# Patient Record
Sex: Female | Born: 1945
Health system: Southern US, Community
[De-identification: ages and names within clinical notes are randomized; demographics above are authoritative.]

## PROBLEM LIST (undated history)

## (undated) DIAGNOSIS — I10 Essential (primary) hypertension: Secondary | ICD-10-CM

## (undated) DIAGNOSIS — I639 Cerebral infarction, unspecified: Secondary | ICD-10-CM

## (undated) HISTORY — DX: Cerebral infarction, unspecified: I63.9

## (undated) HISTORY — DX: Essential (primary) hypertension: I10

---

## 1999-02-08 ENCOUNTER — Encounter: Payer: Self-pay | Admitting: Emergency Medicine

## 1999-02-08 ENCOUNTER — Emergency Department (HOSPITAL_COMMUNITY): Admission: EM | Admit: 1999-02-08 | Discharge: 1999-02-09 | Payer: Self-pay | Admitting: Emergency Medicine

## 1999-05-18 ENCOUNTER — Other Ambulatory Visit: Admission: RE | Admit: 1999-05-18 | Discharge: 1999-05-18 | Payer: Self-pay | Admitting: Obstetrics and Gynecology

## 1999-07-04 ENCOUNTER — Encounter: Admission: RE | Admit: 1999-07-04 | Discharge: 1999-07-04 | Payer: Self-pay | Admitting: Obstetrics and Gynecology

## 1999-07-04 ENCOUNTER — Encounter: Payer: Self-pay | Admitting: Obstetrics and Gynecology

## 1999-07-17 ENCOUNTER — Encounter: Admission: RE | Admit: 1999-07-17 | Discharge: 1999-07-17 | Payer: Self-pay | Admitting: Obstetrics and Gynecology

## 1999-07-17 ENCOUNTER — Encounter: Payer: Self-pay | Admitting: Obstetrics and Gynecology

## 1999-08-24 ENCOUNTER — Emergency Department (HOSPITAL_COMMUNITY): Admission: EM | Admit: 1999-08-24 | Discharge: 1999-08-25 | Payer: Self-pay | Admitting: Emergency Medicine

## 2000-05-23 ENCOUNTER — Other Ambulatory Visit: Admission: RE | Admit: 2000-05-23 | Discharge: 2000-05-23 | Payer: Self-pay | Admitting: Obstetrics and Gynecology

## 2000-12-16 ENCOUNTER — Encounter: Payer: Self-pay | Admitting: Emergency Medicine

## 2000-12-16 ENCOUNTER — Emergency Department (HOSPITAL_COMMUNITY): Admission: EM | Admit: 2000-12-16 | Discharge: 2000-12-17 | Payer: Self-pay | Admitting: Emergency Medicine

## 2000-12-20 ENCOUNTER — Encounter: Payer: Self-pay | Admitting: *Deleted

## 2000-12-20 ENCOUNTER — Emergency Department (HOSPITAL_COMMUNITY): Admission: EM | Admit: 2000-12-20 | Discharge: 2000-12-20 | Payer: Self-pay | Admitting: *Deleted

## 2001-01-20 ENCOUNTER — Encounter: Payer: Self-pay | Admitting: Internal Medicine

## 2001-01-20 ENCOUNTER — Ambulatory Visit (HOSPITAL_COMMUNITY): Admission: RE | Admit: 2001-01-20 | Discharge: 2001-01-20 | Payer: Self-pay | Admitting: Internal Medicine

## 2001-02-12 ENCOUNTER — Encounter: Admission: RE | Admit: 2001-02-12 | Discharge: 2001-02-12 | Payer: Self-pay | Admitting: Obstetrics and Gynecology

## 2001-02-12 ENCOUNTER — Encounter: Payer: Self-pay | Admitting: Obstetrics and Gynecology

## 2001-08-13 ENCOUNTER — Other Ambulatory Visit: Admission: RE | Admit: 2001-08-13 | Discharge: 2001-08-13 | Payer: Self-pay | Admitting: Obstetrics and Gynecology

## 2002-08-06 ENCOUNTER — Encounter: Payer: Self-pay | Admitting: Obstetrics and Gynecology

## 2002-08-06 ENCOUNTER — Ambulatory Visit (HOSPITAL_COMMUNITY): Admission: RE | Admit: 2002-08-06 | Discharge: 2002-08-06 | Payer: Self-pay | Admitting: Obstetrics and Gynecology

## 2002-08-19 ENCOUNTER — Other Ambulatory Visit: Admission: RE | Admit: 2002-08-19 | Discharge: 2002-08-19 | Payer: Self-pay | Admitting: Obstetrics and Gynecology

## 2005-02-22 ENCOUNTER — Encounter (HOSPITAL_COMMUNITY): Admission: RE | Admit: 2005-02-22 | Discharge: 2005-03-24 | Payer: Self-pay | Admitting: Psychiatry

## 2007-03-04 ENCOUNTER — Ambulatory Visit (HOSPITAL_COMMUNITY): Admission: RE | Admit: 2007-03-04 | Discharge: 2007-03-04 | Payer: Self-pay | Admitting: Obstetrics and Gynecology

## 2008-07-06 ENCOUNTER — Ambulatory Visit (HOSPITAL_COMMUNITY): Admission: RE | Admit: 2008-07-06 | Discharge: 2008-07-06 | Payer: Self-pay | Admitting: Obstetrics and Gynecology

## 2008-07-23 ENCOUNTER — Emergency Department (HOSPITAL_COMMUNITY): Admission: EM | Admit: 2008-07-23 | Discharge: 2008-07-23 | Payer: Self-pay | Admitting: Emergency Medicine

## 2009-08-15 ENCOUNTER — Ambulatory Visit (HOSPITAL_COMMUNITY): Admission: RE | Admit: 2009-08-15 | Discharge: 2009-08-15 | Payer: Self-pay | Admitting: Obstetrics and Gynecology

## 2010-01-31 ENCOUNTER — Ambulatory Visit: Payer: Self-pay | Admitting: Internal Medicine

## 2010-01-31 DIAGNOSIS — E669 Obesity, unspecified: Secondary | ICD-10-CM | POA: Insufficient documentation

## 2010-01-31 DIAGNOSIS — E785 Hyperlipidemia, unspecified: Secondary | ICD-10-CM | POA: Insufficient documentation

## 2010-01-31 DIAGNOSIS — I10 Essential (primary) hypertension: Secondary | ICD-10-CM | POA: Insufficient documentation

## 2010-01-31 DIAGNOSIS — E119 Type 2 diabetes mellitus without complications: Secondary | ICD-10-CM | POA: Insufficient documentation

## 2010-02-13 LAB — CONVERTED CEMR LAB
Albumin: 3.5 g/dL (ref 3.5–5.2)
Alkaline Phosphatase: 88 units/L (ref 39–117)
Basophils Absolute: 0.1 10*3/uL (ref 0.0–0.1)
Calcium: 9.4 mg/dL (ref 8.4–10.5)
Chloride: 103 meq/L (ref 96–112)
Direct LDL: 174.4 mg/dL
HCT: 34.7 % — ABNORMAL LOW (ref 36.0–46.0)
HDL: 35.8 mg/dL — ABNORMAL LOW (ref >39.00)
Lymphocytes Relative: 22.6 % (ref 12.0–46.0)
Lymphs Abs: 2 10*3/uL (ref 0.7–4.0)
MCHC: 33.3 g/dL (ref 30.0–36.0)
MCV: 85 fL (ref 78.0–100.0)
Neutro Abs: 5.9 10*3/uL (ref 1.4–7.7)
Platelets: 248 10*3/uL (ref 150.0–400.0)
Potassium: 4.4 meq/L (ref 3.5–5.1)
RBC: 4.08 M/uL (ref 3.87–5.11)
Sodium: 137 meq/L (ref 135–145)
Total Bilirubin: 0.5 mg/dL (ref 0.3–1.2)
Total Protein: 6.6 g/dL (ref 6.0–8.3)

## 2010-04-30 ENCOUNTER — Encounter: Payer: Self-pay | Admitting: Obstetrics and Gynecology

## 2010-05-01 ENCOUNTER — Encounter: Payer: Self-pay | Admitting: Family Medicine

## 2010-05-09 NOTE — Assessment & Plan Note (Signed)
Summary: new to est//bcbs//lch   Vital Signs:  Patient profile:   65 year old female Height:      59.5 inches Weight:      183 pounds BMI:     36.47 Temp:     98.4 degrees F oral Pulse rate:   72 / minute Pulse rhythm:   regular BP sitting:   148 / 82  (left arm) Cuff size:   large  Vitals Entered By: Alfred Levins, CMA (January 31, 2010 10:12 AM)  Serial Vital Signs/Assessments:  Time      Position  BP       Pulse  Resp  Temp     By                     138/82                         Birdie Sons MD  CC: establish, fasting   CC:  establish and fasting.  History of Present Illness: here to establish  Preventive Screening-Counseling & Management  Alcohol-Tobacco     Smoking Status: never  Caffeine-Diet-Exercise     Does Patient Exercise: no      Drug Use:  no.    Current Problems (verified): 1)  Hypertension  (ICD-401.9) 2)  Hyperlipidemia  (ICD-272.4) 3)  Diabetes Mellitus, Type II  (ICD-250.00)  Current Medications (verified): 1)  Vitamin D3 50000 Unit Caps (Cholecalciferol) .Marland Kitchen.. 1 By Mouth Weekly 2)  Lisinopril-Hydrochlorothiazide 20-25 Mg  Tabs (Lisinopril-Hydrochlorothiazide) .... Take 1 Tab By Mouth Daily 3)  Simvastatin 80 Mg Tabs (Simvastatin) .Marland Kitchen.. 1 By Mouth At Bedtime 4)  Labetalol Hcl 300 Mg  Tabs (Labetalol Hcl) .... Take 1 Tab By Mouth Twice A Day 5)  Aspirin 325 Mg Tabs (Aspirin) .... Take 1 Tab By Mouth Every Day 6)  Glyburide-Metformin 2.5-500 Mg Tabs (Glyburide-Metformin) .Marland Kitchen.. 1 By Mouth Two Times A Day 7)  Verapamil Hcl Cr 120 Mg Cr-Tabs (Verapamil Hcl) .Marland Kitchen.. 1 By Mouth Once Daily  Allergies (verified): 1)  * Janumet  Past History:  Family History: Last updated: 01/31/2010 Brain Cancer - Brother deceased age 37 Family History Breast cancer 1st degree relative <50--2 sisters mother father-deceased pneumonia age 67, hx of seizure disorder  mother-- dm2, htn  Social History: Last updated: 01/31/2010 Married Never Smoked Alcohol  use-no Drug use-no Regular exercise-no  Risk Factors: Exercise: no (01/31/2010)  Risk Factors: Smoking Status: never (01/31/2010)  Past Medical History: Diabetes mellitus, type II Hyperlipidemia Hypertension  Past Surgical History: Hysterectomy   Family History: Brain Cancer - Brother deceased age 31 Family History Breast cancer 1st degree relative <50--2 sisters mother father-deceased pneumonia age 32, hx of seizure disorder  mother-- dm2, htn  Social History: Married Never Smoked Alcohol use-no Drug use-no Regular exercise-no Smoking Status:  never Drug Use:  no Does Patient Exercise:  no   Physical Exam  General:  overweight female in no acute distress. HEENT exam atraumatic, normocephalic symmetric her muscles are intact. Conjunctivae are pink. Neck is supple without lymphadenopathy or thyromegaly. Chest clear to auscultation without increased work of breathing. Cardiac exam S1-S2 are regular. Abdominal exam overweight, to bowel sounds, soft, nontender. She has a lot of central obesity. Extities there is no clubbing cyanosis or edema. Neurologic exam she is alert and oriented. Gait is normal.  Diabetes Management Exam:    Eye Exam:       Eye Exam done elsewhere  Date: 01/07/2010          Results: normal-pt's report          Done by: ophthalmology   Impression & Recommendations:  Problem # 1:  HYPERTENSION (ICD-401.9)  Her updated medication list for this problem includes:    Lisinopril-hydrochlorothiazide 20-25 Mg Tabs (Lisinopril-hydrochlorothiazide) .Marland Kitchen... Take 1 tab by mouth daily    Labetalol Hcl 300 Mg Tabs (Labetalol hcl) .Marland Kitchen... Take 1 tab by mouth twice a day    Verapamil Hcl Cr 120 Mg Cr-tabs (Verapamil hcl) .Marland Kitchen... 1 by mouth once daily  BP today: 148/82  Orders: Venipuncture (16109) TLB-BMP (Basic Metabolic Panel-BMET) (80048-METABOL) Specimen Handling (60454)  Problem # 2:  HYPERLIPIDEMIA (ICD-272.4)  needs labs Her updated  medication list for this problem includes:    Simvastatin 80 Mg Tabs (Simvastatin) .Marland Kitchen... 1 by mouth at bedtime  Orders: TLB-TSH (Thyroid Stimulating Hormone) (84443-TSH) TLB-Hepatic/Liver Function Pnl (80076-HEPATIC) Specimen Handling (09811)  Problem # 3:  DIABETES MELLITUS, TYPE II (ICD-250.00)  check labs today she does not check CBGs at home regularly--i'll review labs prior to recommendation of CBG monitoring Her updated medication list for this problem includes:    Lisinopril-hydrochlorothiazide 20-25 Mg Tabs (Lisinopril-hydrochlorothiazide) .Marland Kitchen... Take 1 tab by mouth daily    Aspirin 325 Mg Tabs (Aspirin) .Marland Kitchen... Take 1 tab by mouth every day    Glyburide-metformin 2.5-500 Mg Tabs (Glyburide-metformin) .Marland Kitchen... 1 by mouth two times a day  Orders: TLB-Lipid Panel (80061-LIPID) TLB-CBC Platelet - w/Differential (85025-CBCD) TLB-A1C / Hgb A1C (Glycohemoglobin) (83036-A1C) Specimen Handling (91478)  Problem # 4:  OBESITY (ICD-278.00) this is likely her most significant problem. Discussed need for weight loss. May need referral to nutritionist. Will review labs first.  Complete Medication List: 1)  Vitamin D3 50000 Unit Caps (Cholecalciferol) .Marland Kitchen.. 1 by mouth weekly 2)  Lisinopril-hydrochlorothiazide 20-25 Mg Tabs (Lisinopril-hydrochlorothiazide) .... Take 1 tab by mouth daily 3)  Simvastatin 80 Mg Tabs (Simvastatin) .Marland Kitchen.. 1 by mouth at bedtime 4)  Labetalol Hcl 300 Mg Tabs (Labetalol hcl) .... Take 1 tab by mouth twice a day 5)  Aspirin 325 Mg Tabs (Aspirin) .... Take 1 tab by mouth every day 6)  Glyburide-metformin 2.5-500 Mg Tabs (Glyburide-metformin) .Marland Kitchen.. 1 by mouth two times a day 7)  Verapamil Hcl Cr 120 Mg Cr-tabs (Verapamil hcl) .Marland Kitchen.. 1 by mouth once daily  Other Orders: Admin 1st Vaccine (29562) Flu Vaccine 30yrs + [phone])  Preventive Care Screening  Colonoscopy:    Date:  04/10/2007    Next Due:  04/2017    Results:  normal-pts report   Mammogram:    Date:   01/07/2010    Next Due:  01/2012    Results:  normal-pt's report   Pap Smear:    Date:  01/07/2010    Next Due:  01/2013    Results:  normal-pt's report   Last Flu Shot:    Date:  01/31/2010    Results:  Fluvax 3+  Last Pneumovax:    Date:  04/09/1993    Results:  given    Patient Instructions: 1)  see me 4 months Flu Vaccine Consent Questions     Do you have a history of severe allergic reactions to this vaccine? no    Any prior history of allergic reactions to egg and/or gelatin? no    Do you have a sensitivity to the preservative Thimersol? no    Do you have a past history of Guillan-Barre Syndrome? no    Do you currently have an  acute febrile illness? no    Have you ever had a severe reaction to latex? no    Vaccine information given and explained to patient? yes    Are you currently pregnant? no    Lot Number:[id number]   Exp Date:10/07/2010   Site Given  Left Deltoid IM  Orders Added: 1)  Admin 1st Vaccine [90471] 2)  Flu Vaccine 59yrs + [86578] 3)  New Patient Level IV [99204] 4)  Venipuncture [36415] 5)  TLB-BMP (Basic Metabolic Panel-BMET) [80048-METABOL] 6)  TLB-Lipid Panel [80061-LIPID] 7)  TLB-TSH (Thyroid Stimulating Hormone) [84443-TSH] 8)  TLB-Hepatic/Liver Function Pnl [80076-HEPATIC] 9)  TLB-CBC Platelet - w/Differential [85025-CBCD] 10)  TLB-A1C / Hgb A1C (Glycohemoglobin) [83036-A1C] 11)  Specimen Handling [99000]   .[email or web address]    Appended Document: new to est//bcbs//lch    Nurse Visit   Allergies: 1)  * Janumet  Immunizations Administered:  Tetanus Vaccine:    Vaccine Type: Tdap    Site: right deltoid    Mfr: GlaxoSmithKline    Dose: 0.5 ml    Route: IM    Given by: Alfred Levins, CMA    Exp. Date: 01/27/2012    Lot #: [id number]  Zostavax # 1:    Vaccine Type: Zostavax    Site: right arm    Mfr: Merck    Dose: 0.5 ml    Route: IM    Given by: Alfred Levins, CMA    Exp. Date: 11/25/2010    Lot #: [id number]  Orders Added: 1)   Tdap => 45yrs IM [90715] 2)  Admin 1st Vaccine [90471] 3)  Zoster (Shingles) Vaccine Live [90736] 4)  Admin of Any Addtl Vaccine [10272]

## 2010-05-10 ENCOUNTER — Encounter: Payer: Self-pay | Admitting: Obstetrics and Gynecology

## 2010-08-14 ENCOUNTER — Other Ambulatory Visit (HOSPITAL_COMMUNITY): Payer: Self-pay | Admitting: Obstetrics and Gynecology

## 2010-08-14 DIAGNOSIS — Z1231 Encounter for screening mammogram for malignant neoplasm of breast: Secondary | ICD-10-CM

## 2010-08-29 ENCOUNTER — Ambulatory Visit (HOSPITAL_COMMUNITY)
Admission: RE | Admit: 2010-08-29 | Discharge: 2010-08-29 | Disposition: A | Discharge status: Home / Self Care | Payer: Medicare Other | Source: Ambulatory Visit | Attending: Obstetrics and Gynecology | Admitting: Obstetrics and Gynecology

## 2010-08-29 DIAGNOSIS — Z1231 Encounter for screening mammogram for malignant neoplasm of breast: Secondary | ICD-10-CM

## 2011-08-07 ENCOUNTER — Other Ambulatory Visit (HOSPITAL_COMMUNITY): Payer: Self-pay | Admitting: Obstetrics and Gynecology

## 2011-08-07 DIAGNOSIS — Z1231 Encounter for screening mammogram for malignant neoplasm of breast: Secondary | ICD-10-CM

## 2011-09-05 ENCOUNTER — Ambulatory Visit (HOSPITAL_COMMUNITY)
Admission: RE | Admit: 2011-09-05 | Discharge: 2011-09-05 | Disposition: A | Discharge status: Home / Self Care | Payer: Medicare Other | Source: Ambulatory Visit | Attending: Obstetrics and Gynecology | Admitting: Obstetrics and Gynecology

## 2011-09-05 DIAGNOSIS — Z1231 Encounter for screening mammogram for malignant neoplasm of breast: Secondary | ICD-10-CM | POA: Insufficient documentation

## 2012-01-07 ENCOUNTER — Other Ambulatory Visit (HOSPITAL_COMMUNITY): Payer: Self-pay | Admitting: Internal Medicine

## 2012-01-07 DIAGNOSIS — Z0000 Encounter for general adult medical examination without abnormal findings: Secondary | ICD-10-CM

## 2012-01-17 ENCOUNTER — Other Ambulatory Visit (HOSPITAL_COMMUNITY): Payer: Self-pay | Admitting: Obstetrics and Gynecology

## 2012-01-17 DIAGNOSIS — M949 Disorder of cartilage, unspecified: Secondary | ICD-10-CM

## 2012-01-17 DIAGNOSIS — M899 Disorder of bone, unspecified: Secondary | ICD-10-CM

## 2012-01-22 ENCOUNTER — Ambulatory Visit (HOSPITAL_COMMUNITY): Payer: Medicare PPO

## 2012-01-28 ENCOUNTER — Ambulatory Visit (HOSPITAL_COMMUNITY)
Admission: RE | Admit: 2012-01-28 | Discharge: 2012-01-28 | Disposition: A | Discharge status: Home / Self Care | Payer: Medicare Other | Source: Ambulatory Visit | Attending: Obstetrics and Gynecology | Admitting: Obstetrics and Gynecology

## 2012-01-28 DIAGNOSIS — Z780 Asymptomatic menopausal state: Secondary | ICD-10-CM | POA: Insufficient documentation

## 2012-01-28 DIAGNOSIS — Z13820 Encounter for screening for osteoporosis: Secondary | ICD-10-CM | POA: Insufficient documentation

## 2012-01-28 DIAGNOSIS — M949 Disorder of cartilage, unspecified: Secondary | ICD-10-CM | POA: Insufficient documentation

## 2012-01-28 DIAGNOSIS — M899 Disorder of bone, unspecified: Secondary | ICD-10-CM

## 2012-09-29 ENCOUNTER — Other Ambulatory Visit (HOSPITAL_COMMUNITY): Payer: Self-pay | Admitting: Obstetrics and Gynecology

## 2012-09-29 DIAGNOSIS — Z1231 Encounter for screening mammogram for malignant neoplasm of breast: Secondary | ICD-10-CM

## 2012-10-13 ENCOUNTER — Ambulatory Visit (HOSPITAL_COMMUNITY): Payer: Medicare Other | Attending: Obstetrics and Gynecology

## 2012-10-17 ENCOUNTER — Other Ambulatory Visit (HOSPITAL_COMMUNITY): Payer: Self-pay | Admitting: Obstetrics and Gynecology

## 2012-10-17 DIAGNOSIS — Z1231 Encounter for screening mammogram for malignant neoplasm of breast: Secondary | ICD-10-CM

## 2012-10-27 ENCOUNTER — Ambulatory Visit (HOSPITAL_COMMUNITY): Payer: Medicare Other

## 2012-11-04 ENCOUNTER — Ambulatory Visit (HOSPITAL_COMMUNITY)
Admission: RE | Admit: 2012-11-04 | Discharge: 2012-11-04 | Disposition: A | Discharge status: Home / Self Care | Payer: Medicare Other | Source: Ambulatory Visit | Attending: Obstetrics and Gynecology | Admitting: Obstetrics and Gynecology

## 2012-11-04 DIAGNOSIS — Z1231 Encounter for screening mammogram for malignant neoplasm of breast: Secondary | ICD-10-CM | POA: Insufficient documentation

## 2013-03-30 ENCOUNTER — Ambulatory Visit (HOSPITAL_COMMUNITY)
Admission: RE | Admit: 2013-03-30 | Discharge: 2013-03-30 | Disposition: A | Discharge status: Home / Self Care | Payer: Medicare Other | Source: Ambulatory Visit | Attending: Family Medicine | Admitting: Family Medicine

## 2013-03-30 ENCOUNTER — Other Ambulatory Visit (HOSPITAL_COMMUNITY): Payer: Self-pay | Admitting: Family Medicine

## 2013-03-30 DIAGNOSIS — R059 Cough, unspecified: Secondary | ICD-10-CM | POA: Insufficient documentation

## 2013-03-30 DIAGNOSIS — J209 Acute bronchitis, unspecified: Secondary | ICD-10-CM

## 2013-03-30 DIAGNOSIS — R05 Cough: Secondary | ICD-10-CM | POA: Insufficient documentation

## 2013-03-30 DIAGNOSIS — IMO0002 Reserved for concepts with insufficient information to code with codable children: Secondary | ICD-10-CM | POA: Insufficient documentation

## 2013-03-30 DIAGNOSIS — R0989 Other specified symptoms and signs involving the circulatory and respiratory systems: Secondary | ICD-10-CM | POA: Insufficient documentation

## 2013-06-25 ENCOUNTER — Ambulatory Visit (INDEPENDENT_AMBULATORY_CARE_PROVIDER_SITE_OTHER): Payer: Medicare HMO | Admitting: Internal Medicine

## 2013-06-25 ENCOUNTER — Encounter: Payer: Self-pay | Admitting: Internal Medicine

## 2013-06-25 VITALS — BP 150/64 | HR 61 | Temp 97.4°F | Ht 59.0 in | Wt 143.0 lb

## 2013-06-25 DIAGNOSIS — I10 Essential (primary) hypertension: Secondary | ICD-10-CM

## 2013-06-25 DIAGNOSIS — R058 Other specified cough: Secondary | ICD-10-CM

## 2013-06-25 DIAGNOSIS — R05 Cough: Secondary | ICD-10-CM

## 2013-06-25 DIAGNOSIS — R059 Cough, unspecified: Secondary | ICD-10-CM

## 2013-06-25 MED ORDER — VALSARTAN-HYDROCHLOROTHIAZIDE 160-25 MG PO TABS: 1.0000 | ORAL_TABLET | Freq: Every day | ORAL | Status: DC

## 2013-06-25 NOTE — Patient Instructions (Signed)
Try off lisinopril  Valsartan 160/25 one daily  X 6 weeks and if you start coughing again go ahead and take the tessilon  GERD (REFLUX)  is an extremely common cause of respiratory symptoms, many times with no significant heartburn at all.    It can be treated with medication, but also with lifestyle changes including avoidance of late meals, excessive alcohol, smoking cessation, and avoid fatty foods, chocolate, peppermint, colas, red wine, and acidic juices such as orange juice.  NO MINT OR MENTHOL PRODUCTS SO NO COUGH DROPS  USE SUGARLESS CANDY INSTEAD (jolley ranchers or Stover's)  NO OIL BASED VITAMINS - use powdered substitutes.    If all better in 6 weeks see your primary doctor and if not return here.

## 2013-06-25 NOTE — Progress Notes (Signed)
   Subjective:    Patient ID: Shelly Bauer, female    DOB: 1945/05/06   MRN: [id number]  HPI  [age] yowf never smoker with onset of ? [place] 6620s [place] rx then 2002 returned to [place] sev times a year spring / fall  a cough seemed to get better with albuterol only for short courses and "did fine" between flares  until Dec 2014 recurrent cough / sob seemed better with streroids/ levaquin but then cough recurred 06/27/13 and self referred 06/25/2013 to pulmonary clinic.  06/25/2013 1st [facility] office visit/ Wert on ACEi Chief Complaint  Patient presents with  . Pulmonary Consult    Self referral- pt c/o cough x 1 wk-non prod and worse with exertion.    got ? Steroid shot one week prior to OV  And some better on day of ov but cough was quite severe assoc with sensation of pnds but no excess mucus, cough day > night, assoc with sense of nasal and throat congestion  No obvious other patterns in day to day or daytime variabilty or assoc sob or cp or chest tightness, subjective wheeze overt sinus or hb symptoms. No unusual exp hx or h/o childhood pna/ asthma or knowledge of premature birth.  Sleeping ok without nocturnal  or early am exacerbation  of respiratory  c/o's or need for noct saba. Also denies any obvious fluctuation of symptoms with weather or environmental changes or other aggravating or alleviating factors except as outlined above   Current Medications, Allergies, Complete Past Medical History, Past Surgical History, Family History, and Social History were reviewed in [facility] electronic medical record.            Review of Systems  Constitutional: Negative for fever, chills and unexpected weight change.  HENT: Positive for sinus pressure. Negative for congestion, dental problem, ear pain, nosebleeds, postnasal drip, rhinorrhea, sneezing, sore throat, trouble swallowing and voice change.   Eyes: Negative for visual  disturbance.  Respiratory: Positive for cough. Negative for choking and shortness of breath.   Cardiovascular: Negative for chest pain and leg swelling.  Gastrointestinal: Negative for vomiting, abdominal pain and diarrhea.  Genitourinary: Negative for difficulty urinating.  Musculoskeletal: Negative for arthralgias.  Skin: Negative for rash.  Neurological: Negative for tremors, syncope and headaches.  Hematological: Does not bruise/bleed easily.       Objective:   Physical Exam  Wt Readings from Last 3 Encounters:  06/25/13 143 lb (64.864 kg)  01/31/10 183 lb (83.008 kg)     HEENT: nl dentition, turbinates, and orophanx. Nl external ear canals without cough reflex   NECK :  without JVD/Nodes/TM/ nl carotid upstrokes bilaterally   LUNGS: no acc muscle use, clear to A and P bilaterally without cough on insp or exp maneuvers   CV:  RRR  no s3 or murmur or increase in P2, no edema   ABD:  soft and nontender with nl excursion in the supine position. No bruits or organomegaly, bowel sounds nl  MS:  warm without deformities, calf tenderness, cyanosis or clubbing  SKIN: warm and dry without lesions    NEURO:  alert, approp, no deficits     cxr 03/30/13 There is no evidence of pneumonia nor CHF nor other acute  cardiopulmonary abnormality.       Assessment & Plan:

## 2013-06-26 DIAGNOSIS — R058 Other specified cough: Secondary | ICD-10-CM | POA: Insufficient documentation

## 2013-06-26 DIAGNOSIS — R05 Cough: Secondary | ICD-10-CM | POA: Insufficient documentation

## 2013-06-26 NOTE — Assessment & Plan Note (Signed)
This is almost certainly  Classic Upper airway cough syndrome, so named because it's frequently impossible to sort out how much is  CR/sinusitis with freq throat clearing (which can be related to primary GERD)   vs  causing  secondary (" extra esophageal")  GERD from wide swings in gastric pressure that occur with throat clearing, often  promoting self use of mint and menthol lozenges that reduce the lower esophageal sphincter tone and exacerbate the problem further in a cyclical fashion.   These are the same pts (now being labeled as having "irritable larynx syndrome" by some cough centers) who not infrequently have a history of having failed to tolerate ace inhibitors,  dry powder inhalers or biphosphonates or report having atypical reflux symptoms that don't respond to standard doses of PPI , and are easily confused as having aecopd or asthma flares by even experienced allergists/ pulmonologists.  Since already treated approp and not improving rec trial off acei then return here is 6 weeks or with next flare off acei and go from there.  See instructions for specific recommendations which were reviewed directly with the patient who was given a copy with highlighter outlining the key components.

## 2013-06-26 NOTE — Assessment & Plan Note (Signed)
ACE inhibitors are problematic in  pts with airway complaints because  even experienced pulmonologists can't always distinguish ace effects from copd/asthma.  By themselves they don't actually cause a problem, much like oxygen can't by itself start a fire, but they certainly serve as a powerful catalyst or enhancer for any "fire"  or inflammatory process in the upper airway, be it caused by an ET  tube or more commonly reflux (especially in the obese or pts with known GERD or who are on biphoshonates).    In the era of ARB near equivalency until we have a better handle on the reversibility of the airway problem, it just makes sense to avoid ACEI  entirely in the short run and then decide later, having established a level of airway control using a reasonable limited regimen, whether to add back ace but even then being very careful to observe the pt for worsening airway control and number of meds used/ needed to control symptoms.    Given her tendency to recurrent severe cough I would avoid this class completely so as not to "muddy" the water in terms of sorting out her various non-specific resp symptoms

## 2013-08-18 ENCOUNTER — Telehealth: Payer: Self-pay | Admitting: Internal Medicine

## 2013-08-18 NOTE — Telephone Encounter (Signed)
Pt returning call.[name] ° °

## 2013-08-18 NOTE — Telephone Encounter (Signed)
Called spoke with pt. She reports she is not sure who rx'd verapamil for her. We gave her diovan HCT and she still takes this. She wants to know if she needs to be on verapamil. IN epic it states she takes 120 mg. Please advise MW thanks

## 2013-08-18 NOTE — Telephone Encounter (Signed)
lmomtcb x1 

## 2013-08-19 NOTE — Telephone Encounter (Signed)
Spoke with the pt and notified of recs per MW  She has been taking med  Will f/u with PCP for refills  Nothing further needed per pt

## 2013-08-19 NOTE — Telephone Encounter (Signed)
lmomtcb x1 

## 2013-08-19 NOTE — Telephone Encounter (Signed)
I did not change the rx for verapamil  - if  she's been taking the verapamil she should stay on it but see her primary for refills - come see Tammy Np in meantime if confused or needs recheck

## 2013-10-06 ENCOUNTER — Other Ambulatory Visit (HOSPITAL_COMMUNITY): Payer: Self-pay | Admitting: Obstetrics and Gynecology

## 2013-10-06 DIAGNOSIS — Z803 Family history of malignant neoplasm of breast: Secondary | ICD-10-CM

## 2013-11-10 ENCOUNTER — Ambulatory Visit (HOSPITAL_COMMUNITY): Payer: Medicare HMO | Attending: Obstetrics and Gynecology

## 2014-03-09 ENCOUNTER — Ambulatory Visit (HOSPITAL_COMMUNITY)
Admission: RE | Admit: 2014-03-09 | Discharge: 2014-03-09 | Disposition: A | Discharge status: Home / Self Care | Payer: Medicare HMO | Source: Ambulatory Visit | Attending: Obstetrics and Gynecology | Admitting: Obstetrics and Gynecology

## 2014-03-09 DIAGNOSIS — Z1231 Encounter for screening mammogram for malignant neoplasm of breast: Secondary | ICD-10-CM | POA: Insufficient documentation

## 2014-03-09 DIAGNOSIS — Z803 Family history of malignant neoplasm of breast: Secondary | ICD-10-CM

## 2014-08-03 ENCOUNTER — Other Ambulatory Visit: Payer: Self-pay | Admitting: Internal Medicine

## 2014-08-05 ENCOUNTER — Telehealth: Payer: Self-pay | Admitting: Internal Medicine

## 2014-08-05 MED ORDER — VALSARTAN-HYDROCHLOROTHIAZIDE 160-25 MG PO TABS: 1.0000 | ORAL_TABLET | Freq: Every day | ORAL | Status: DC

## 2014-08-05 NOTE — Telephone Encounter (Signed)
Ok to refill 

## 2014-08-05 NOTE — Telephone Encounter (Signed)
Pt aware RX has been refilled. Nothing further needed

## 2014-08-05 NOTE — Telephone Encounter (Signed)
Pt needing Valsartan refilled ( Rx given by MW initially ) Pt is not able to see PCP until May 16th and he will not fill this until he follows up with her. Please advise Dr Sherene SiresWert if you are okay with filling this x 1 month. Thanks

## 2014-08-05 NOTE — Telephone Encounter (Signed)
Called and spoke to pt. Pt requesting refill on valsartan. Informed pt to have her PCP fill this for her as she is doing well and MW has not seen her in over 1 year. Pt verbalized understanding and denied any further questions or concerns at this time.

## 2014-08-05 NOTE — Telephone Encounter (Signed)
lmtcb for pt.  

## 2014-08-23 ENCOUNTER — Ambulatory Visit: Payer: Medicare HMO | Admitting: Internal Medicine

## 2014-09-09 ENCOUNTER — Other Ambulatory Visit: Payer: Self-pay | Admitting: Internal Medicine

## 2014-09-13 ENCOUNTER — Ambulatory Visit (INDEPENDENT_AMBULATORY_CARE_PROVIDER_SITE_OTHER): Payer: Medicare HMO | Admitting: Internal Medicine

## 2014-09-13 ENCOUNTER — Encounter: Payer: Self-pay | Admitting: Internal Medicine

## 2014-09-13 VITALS — BP 164/80 | HR 82 | Ht 59.0 in | Wt 143.0 lb

## 2014-09-13 DIAGNOSIS — R05 Cough: Secondary | ICD-10-CM | POA: Diagnosis not present

## 2014-09-13 DIAGNOSIS — R058 Other specified cough: Secondary | ICD-10-CM

## 2014-09-13 MED ORDER — PREDNISONE 10 MG PO TABS: ORAL_TABLET | ORAL | Status: DC

## 2014-09-13 NOTE — Patient Instructions (Addendum)
Any time you start coughing for any reason go ahead and tessilon and Try over the counter prilosec 20mg   Take 30-60 min before first meal of the day and Pepcid (famotidine) 20 mg one bedtime until cough is completely gone for at least a week without the need for cough suppression  GERD (REFLUX)  is an extremely common cause of respiratory symptoms just like yours , many times with no obvious heartburn at all.    It can be treated with medication, but also with lifestyle changes including avoidance of late meals, elevation of the head of your bed (ideally with 6 inch  bed blocks) excessive alcohol, smoking cessation, and avoid fatty foods, chocolate, peppermint, colas, red wine, and acidic juices such as orange juice.  NO MINT OR MENTHOL PRODUCTS SO NO COUGH DROPS  USE SUGARLESS CANDY INSTEAD (Jolley ranchers or Stover's or Life Savers) or even ice chips will also do - the key is to swallow to prevent all throat clearing. NO OIL BASED VITAMINS - use powdered substitutes.    Prednisone 10 mg take  4 each am x 2 days,   2 each am x 2 days,  1 each am x 2 days and stop    If you are satisfied with your treatment plan,  let your doctor know and he/she can either refill your medications or you can return here when your prescription runs out.     If in any way you are not 100% satisfied,  please tell us.  If 100% better, tell your friends!  Pulmonary follow up is as needed

## 2014-09-13 NOTE — Progress Notes (Signed)
Subjective:    Patient ID: Shelly Bauer, female    DOB: [birth year]   MRN: [id number]   Brief patient profile:  [age] yowf never smoker with onset of ? [place] 8120s [place] then 2002 returned to [place] sev times a year spring / fall  a cough seemed to get better with albuterol only for short courses and "did fine" between flares  until Dec 2014 recurrent cough / sob seemed better with streroids/ levaquin but then cough recurred 06/27/13 and self referred 06/25/2013 to [facility].   History of Present Illness  06/25/2013 1st Shelly [facility] office visit/ Shelly Bauer on ACEi Chief Complaint  Patient presents with  . [facility] Consult    Self referral- pt c/o cough x 1 wk-non prod and worse with exertion.    got ? Steroid shot one week prior to OV  And some better on day of ov but cough was quite severe assoc with sensation of pnds but no excess mucus, cough day > night, assoc with sense of nasal and throat congestion rec Try off lisinopril Valsartan 160/25 one daily  X 6 weeks and if you start coughing again go ahead and take the tessilon GERD diet  F/u if not 100% better     09/13/2014 f/u ov/Shelly Bauer re: recurrent cough x one week  Chief Complaint  Patient presents with  . Follow-up    Pt c/o dry cough with burning sensation in chest. Wheezing. Denies any SOB, chest congestion.   cough had completely resolved then onset was acute, sever, worse  day > noct / has not tried  tessilon because cough  goes away quickly  Assoc with overt HB sympt and subjective wheeze  No obvious day to day or daytime variabilty or assoc c chest tightness,  or overt sinus.  No unusual exp hx or h/o childhood pna/ asthma or knowledge of premature birth.  Sleeping ok without nocturnal  or early am exacerbation  of respiratory  c/o's or need for noct saba. Also denies any obvious fluctuation of symptoms with weather or environmental changes or other aggravating or  alleviating factors except as outlined above   Current Medications, Allergies, Complete Past Medical History, Past Surgical History, Family History, and Social History were reviewed in [facility] electronic medical record.  ROS  The following are not active complaints unless bolded sore throat, dysphagia, dental problems, itching, sneezing,  nasal congestion or excess/ purulent secretions, ear ache,   fever, chills, sweats, unintended wt loss, clasically pleuritic or exertional cp, hemoptysis,  orthopnea pnd or leg swelling, presyncope, palpitations, heartburn, abdominal pain, anorexia, nausea, vomiting, diarrhea  or change in bowel or urinary habits, change in stools or urine, dysuria,hematuria,  rash, arthralgias, visual complaints, headache, numbness weakness or ataxia or problems with walking or coordination,  change in mood/affect or memory.                  Objective:   Physical Exam  Wt Readings from Last 3 Encounters:  09/13/14 143 lb (64.864 kg)  06/25/13 143 lb (64.864 kg)  01/31/10 183 lb (83.008 kg)    Vital signs reviewed     HEENT: nl dentition, turbinates, and orophanx. Nl external ear canals without cough reflex   NECK :  without JVD/Nodes/TM/ nl carotid upstrokes bilaterally   LUNGS: no acc muscle use, clear to A and P bilaterally without cough on insp or exp maneuvers   CV:  RRR  no s3 or murmur or increase in P2, no edema   ABD:  soft and nontender with nl excursion in the supine position. No bruits or organomegaly, bowel sounds nl  MS:  warm without deformities, calf tenderness, cyanosis or clubbing  SKIN: warm and dry without lesions    NEURO:  alert, approp, no deficits            Assessment & Plan:

## 2014-09-14 ENCOUNTER — Other Ambulatory Visit: Payer: Self-pay | Admitting: Internal Medicine

## 2014-09-15 ENCOUNTER — Telehealth: Payer: Self-pay | Admitting: Internal Medicine

## 2014-09-15 MED ORDER — VALSARTAN-HYDROCHLOROTHIAZIDE 160-25 MG PO TABS: 1.0000 | ORAL_TABLET | Freq: Every day | ORAL | Status: DC

## 2014-09-15 NOTE — Telephone Encounter (Signed)
Spoke with pt. She is needing a refill on Valsartan-HCTZ. This was to be done when she was here on Monday and was not. Rx has been sent in. Nothing further was needed.

## 2014-09-19 ENCOUNTER — Encounter: Payer: Self-pay | Admitting: Internal Medicine

## 2014-09-19 NOTE — Assessment & Plan Note (Addendum)
The most common causes of chronic cough in immunocompetent adults include the following: upper airway cough syndrome (UACS), previously referred to as postnasal drip syndrome (PNDS), which is caused by variety of rhinosinus conditions; (2) asthma; (3) GERD; (4) chronic bronchitis from cigarette smoking or other inhaled environmental irritants; (5) nonasthmatic eosinophilic bronchitis; and (6) bronchiectasis.   These conditions, singly or in combination, have accounted for up to 94% of the causes of chronic cough in prospective studies.   Other conditions have constituted no >6% of the causes in prospective studies These have included bronchogenic carcinoma, chronic interstitial pneumonia, sarcoidosis, left ventricular failure, ACEI-induced cough, and aspiration from a condition associated with pharyngeal dysfunction.    Chronic cough is often simultaneously caused by more than one condition. A single cause has been found from 38 to 82% of the time, multiple causes from 18 to 62%. Multiply caused cough has been the result of three diseases up to 42% of the time.       Based on hx and exam, this is most likely:  Recurrent  Upper airway cough syndrome, so named because it's frequently impossible to sort out how much is  CR/sinusitis with freq throat clearing (which can be related to primary GERD)   vs  causing  secondary (" extra esophageal")  GERD from wide swings in gastric pressure that occur with throat clearing, often  promoting self use of mint and menthol lozenges that reduce the lower esophageal sphincter tone and exacerbate the problem further in a cyclical fashion.   These are the same pts (now being labeled as having "irritable larynx syndrome" by some cough centers) who not infrequently have a history of having failed to tolerate ace inhibitors,  dry powder inhalers or biphosphonates or report having atypical reflux symptoms that don't respond to standard doses of PPI , and are easily confused  as having aecopd or asthma flares by even experienced allergists/ pulmonologists.   The first step is to maximize acid suppression and eliminate cyclical coughing then regroup if the cough persists with cxr  I had an extended discussion with the patient reviewing all relevant studies completed to date and  lasting 15 to 20 minutes of a 25 minute visit on the following ongoing concerns:  Each maintenance medication was reviewed in detail including most importantly the difference between maintenance and as needed and under what circumstances the prns are to be used.  Please see instructions for details which were reviewed in writing and the patient given a copy.   In event of future flairs :  Explained the natural history of uri and why it's necessary in patients at risk to treat GERD aggressively - at least  short term -   to reduce risk of evolving cyclical cough initially  triggered by epithelial injury and a heightened sensitivty to the effects of any upper airway irritants,  most importantly acid - related - then perpetuated by epithelial injury related to the cough itself as the upper airway collapses on itself.  That is, the more sensitive the epithelium becomes once it is damaged by the virus, the more the ensuing irritability> the more the cough, the more the secondary reflux (especially in those prone to reflux) the more the irritation of the sensitive mucosa and so on in a  Classic cyclical pattern.     Pulmonary f/u is prn if fails to respond to above measures

## 2014-12-31 ENCOUNTER — Other Ambulatory Visit: Payer: Self-pay | Admitting: Obstetrics and Gynecology

## 2014-12-31 DIAGNOSIS — E2839 Other primary ovarian failure: Secondary | ICD-10-CM

## 2015-02-04 ENCOUNTER — Inpatient Hospital Stay: Admission: RE | Admit: 2015-02-04 | Payer: Medicare HMO | Source: Ambulatory Visit

## 2015-02-10 ENCOUNTER — Other Ambulatory Visit: Payer: Self-pay

## 2015-02-10 DIAGNOSIS — Z1231 Encounter for screening mammogram for malignant neoplasm of breast: Secondary | ICD-10-CM

## 2015-02-18 ENCOUNTER — Telehealth: Payer: Self-pay | Admitting: Internal Medicine

## 2015-02-18 NOTE — Telephone Encounter (Signed)
lmtcb x1 for [employer]inda with [employer].

## 2015-02-18 NOTE — Telephone Encounter (Signed)
Left another message with Bonita QuinLinda. Advised her that we are not the pt's PCP and these medications are typically filled by the pt's PCP. Pollyann SavoyGave Shanikqua that pt's PCP name. Nothing further was needed.

## 2015-02-18 NOTE — Telephone Encounter (Signed)
Shelly Bauer is retuning call she was wanting to know if he r valsartan and glimepiride were discontinued, she says she only needs call back if they are and that it is ok to leave message on phone, if the are not discontinued then pt needs refill on these.Caren GriffinsStanley A Dalton

## 2015-03-15 ENCOUNTER — Ambulatory Visit
Admission: RE | Admit: 2015-03-15 | Discharge: 2015-03-15 | Disposition: A | Discharge status: Home / Self Care | Payer: Medicare HMO | Source: Ambulatory Visit

## 2015-03-15 DIAGNOSIS — Z1231 Encounter for screening mammogram for malignant neoplasm of breast: Secondary | ICD-10-CM

## 2015-03-23 ENCOUNTER — Ambulatory Visit
Admission: RE | Admit: 2015-03-23 | Discharge: 2015-03-23 | Disposition: A | Discharge status: Home / Self Care | Payer: Medicare HMO | Source: Ambulatory Visit | Attending: Obstetrics and Gynecology | Admitting: Obstetrics and Gynecology

## 2015-03-23 DIAGNOSIS — M810 Age-related osteoporosis without current pathological fracture: Secondary | ICD-10-CM | POA: Diagnosis not present

## 2015-03-23 DIAGNOSIS — E2839 Other primary ovarian failure: Secondary | ICD-10-CM

## 2015-04-06 DIAGNOSIS — E663 Overweight: Secondary | ICD-10-CM | POA: Diagnosis not present

## 2015-04-06 DIAGNOSIS — Z6828 Body mass index (BMI) 28.0-28.9, adult: Secondary | ICD-10-CM | POA: Diagnosis not present

## 2015-04-06 DIAGNOSIS — Z1389 Encounter for screening for other disorder: Secondary | ICD-10-CM | POA: Diagnosis not present

## 2015-04-06 DIAGNOSIS — I10 Essential (primary) hypertension: Secondary | ICD-10-CM | POA: Diagnosis not present

## 2015-04-06 DIAGNOSIS — R05 Cough: Secondary | ICD-10-CM | POA: Diagnosis not present

## 2015-04-21 DIAGNOSIS — Z0000 Encounter for general adult medical examination without abnormal findings: Secondary | ICD-10-CM | POA: Diagnosis not present

## 2015-04-21 DIAGNOSIS — M1991 Primary osteoarthritis, unspecified site: Secondary | ICD-10-CM | POA: Diagnosis not present

## 2015-04-21 DIAGNOSIS — Z6827 Body mass index (BMI) 27.0-27.9, adult: Secondary | ICD-10-CM | POA: Diagnosis not present

## 2015-04-21 DIAGNOSIS — I10 Essential (primary) hypertension: Secondary | ICD-10-CM | POA: Diagnosis not present

## 2015-04-21 DIAGNOSIS — E782 Mixed hyperlipidemia: Secondary | ICD-10-CM | POA: Diagnosis not present

## 2015-04-21 DIAGNOSIS — E119 Type 2 diabetes mellitus without complications: Secondary | ICD-10-CM | POA: Diagnosis not present

## 2015-04-21 DIAGNOSIS — E1165 Type 2 diabetes mellitus with hyperglycemia: Secondary | ICD-10-CM | POA: Diagnosis not present

## 2015-04-21 DIAGNOSIS — E663 Overweight: Secondary | ICD-10-CM | POA: Diagnosis not present

## 2015-05-05 ENCOUNTER — Telehealth: Payer: Self-pay | Admitting: Internal Medicine

## 2015-05-05 NOTE — Telephone Encounter (Signed)
lmtcb x1 for [facility].

## 2015-05-09 NOTE — Telephone Encounter (Signed)
Left message for Verl Dicker at [place] to return call.

## 2015-05-10 NOTE — Telephone Encounter (Signed)
lmtcb X3 for Shelly Bauer.  Will close message per triage protocol.

## 2015-09-21 ENCOUNTER — Other Ambulatory Visit: Payer: Self-pay | Admitting: Internal Medicine

## 2015-10-22 ENCOUNTER — Other Ambulatory Visit: Payer: Self-pay | Admitting: Internal Medicine

## 2015-10-26 ENCOUNTER — Telehealth: Payer: Self-pay | Admitting: Internal Medicine

## 2015-10-26 DIAGNOSIS — L089 Local infection of the skin and subcutaneous tissue, unspecified: Secondary | ICD-10-CM | POA: Diagnosis not present

## 2015-10-26 DIAGNOSIS — Z6827 Body mass index (BMI) 27.0-27.9, adult: Secondary | ICD-10-CM | POA: Diagnosis not present

## 2015-10-26 DIAGNOSIS — Z1389 Encounter for screening for other disorder: Secondary | ICD-10-CM | POA: Diagnosis not present

## 2015-10-26 MED ORDER — VALSARTAN-HYDROCHLOROTHIAZIDE 160-25 MG PO TABS: 1.0000 | ORAL_TABLET | Freq: Every day | ORAL | Status: DC

## 2015-10-26 NOTE — Telephone Encounter (Signed)
Per 09/13/14 OV: Patient Instructions       Any time you start coughing for any reason go ahead and tessilon and Try over the counter prilosec 20mg   Take 30-60 min before first meal of the day and Pepcid (famotidine) 20 mg one bedtime until cough is completely gone for at least a week without the need for cough suppression  GERD (REFLUX)  is an extremely common cause of respiratory symptoms just like yours , many times with no obvious heartburn at all.    It can be treated with medication, but also with lifestyle changes including avoidance of late meals, elevation of the head of your bed (ideally with 6 inch  bed blocks) excessive alcohol, smoking cessation, and avoid fatty foods, chocolate, peppermint, colas, red wine, and acidic juices such as orange juice.   NO MINT OR MENTHOL PRODUCTS SO NO COUGH DROPS  USE SUGARLESS CANDY INSTEAD (Jolley ranchers or Stover's or Life Savers) or even ice chips will also do - the key is to swallow to prevent all throat clearing. NO OIL BASED VITAMINS - use powdered substitutes. Prednisone 10 mg take  4 each am x 2 days,   2 each am x 2 days,  1 each am x 2 days and stop    If ou are satisfied with your treatment plan,  let your doctor know and he/she can either refill your medications or you can return here when your prescription runs out.     If in any way you are not 100% satisfied,  please tell us.  If 100% better, tell your friends!  Pulmonary follow up is as needed   Spoke with pt. She needs refill on diovan HCT. RX sent in pending pt appt. Nothing further needed

## 2015-10-27 ENCOUNTER — Ambulatory Visit (HOSPITAL_COMMUNITY): Payer: Medicare HMO | Attending: Family Medicine | Admitting: Physical Therapy

## 2015-10-27 DIAGNOSIS — L97921 Non-pressure chronic ulcer of unspecified part of left lower leg limited to breakdown of skin: Secondary | ICD-10-CM | POA: Diagnosis not present

## 2015-10-27 DIAGNOSIS — L089 Local infection of the skin and subcutaneous tissue, unspecified: Secondary | ICD-10-CM | POA: Diagnosis not present

## 2015-10-27 DIAGNOSIS — X58XXXD Exposure to other specified factors, subsequent encounter: Secondary | ICD-10-CM | POA: Insufficient documentation

## 2015-10-27 DIAGNOSIS — S91302D Unspecified open wound, left foot, subsequent encounter: Secondary | ICD-10-CM | POA: Insufficient documentation

## 2015-10-27 DIAGNOSIS — E0840 Diabetes mellitus due to underlying condition with diabetic neuropathy, unspecified: Secondary | ICD-10-CM

## 2015-10-27 DIAGNOSIS — Z6827 Body mass index (BMI) 27.0-27.9, adult: Secondary | ICD-10-CM | POA: Diagnosis not present

## 2015-10-27 NOTE — Therapy (Signed)
[facility] [address] , [place], [address] Phone: [phone]   Fax:  [phone]  Wound Care Evaluation  Patient Details  Name: Shelly Bauer MRN: [id number] Date of Birth: 1945/07/12 No Data Recorded  Encounter Date: 10/27/2015      PT End of Session - 10/27/15 1227    Visit Number 1   Number of Visits 12   Date for PT Re-Evaluation 11/26/15   Authorization Type [employer] medicare   Authorization - Visit Number 1   Authorization - Number of Visits 12   PT Start Time [phone]   PT Stop Time 1020   PT Time Calculation (min) 30 min   Activity Tolerance Patient tolerated treatment well   Behavior [facility] for tasks assessed/performed      Past Medical History  Diagnosis Date  . Stroke   . Hypertension     No past surgical history on file.  There were no vitals filed for this visit.         Wound Therapy - 10/27/15 1031    Subjective Pt states that she has had a wound on the bottom of her left foot for several weeks now.  She states that she has been to the doctor twice and he felt that she needed antibiotics as well being referred to skilled physical therapy.    Patient and Family Stated Goals wound to heal    Date of Onset 10/06/15   Prior Treatments antibiotic; self care with peroxide   Pain Assessment No/denies pain   Evaluation and Treatment Procedures Explained to Patient/Family Yes   Evaluation and Treatment Procedures agreed to   Lt MTP wound .8x.5 depth at least 2.0 cm. halo callous 2.0   Wound Properties Date First Assessed: 10/27/15 Time First Assessed: 0950 Wound Type: Puncture;Diabetic ulcer Location: Foot Location Orientation: Left Wound Description (Comments): plantar aspect of 5th MTP   Dressing Changed New   Dressing Status None   Site / Wound Assessment Pale   % Wound base Red or Granulating 10%   % Wound base Yellow 90%   Peri-wound Assessment Edema;Other (Comment)  callous halo   Wound Length (cm) 0.8 cm   Wound Width (cm) 0.5 cm   Wound Depth (cm) 2 cm  at least   Undermining (cm) throughout wound   Drainage Amount Minimal   Drainage Description Serous   Treatment Cleansed;Debridement (Selective)   Selective Debridement - Location callous   Selective Debridement - Tools Used Forceps;Scissors   Selective Debridement - Tissue Removed slough and callous    Wound Therapy - Clinical Statement Ms. Ane PaymentHooker has what appears to be a puncture wounnd to the plantar aspect.  She states that despite self care her wound continues to prgress in nature therfore she has been referred to physical therapy.  She states that a callous has formed around her wound which allows her to wear only one pair of her shoes as the others rub on the area causing discomfort.     Wound Therapy - Functional Problem List unable to wear majority of her shoes    Factors Delaying/Impairing Wound Healing Altered sensation;Diabetes Mellitus;Infection - systemic/local   Hydrotherapy Plan Debridement;Dressing change;Patient/family education;Pulsatile lavage with suction   Wound Therapy - Frequency --  2x a week x 6 weeks    Wound Therapy - Current Recommendations PT   Wound Therapy - Follow Up Recommendations Other (comment)   Wound Plan Pt to begin pulse lavage next treatment with therapist  retracting wound to attempt to achieve deeper cleansing.  Debridment of callous followed by dressing using medihoney, 2x2 and kling.  Followed by netting to hold dressing in place.    Dressing  as above                          PT Education - October 28, 2015 1226    Education provided Yes   Education Details Stop using peroxide on wound., details of how to care for wound given    Person(s) Educated Patient   Methods Explanation;Handout;Verbal cues   Comprehension Verbalized understanding          PT Short Term Goals - 10-28-15 1233    PT SHORT TERM GOAL #1   Title Pt to be able to verbalize proper  self care for wound    Time 1   Period Weeks   Status New   PT SHORT TERM GOAL #2   Title Wound depth to decrease by 1cm    Time 3   Period Weeks   Status New   PT SHORT TERM GOAL #3   Title Pt to verbalize the importance of keeping blood sugars at a normal level and increase uptake of protien in the wound healing process.    Time 3   Period Weeks   Status New           PT Long Term Goals - 2015-10-28 1235    PT LONG TERM GOAL #1   Title Pt to no longer have a callous on the plantar aspect of her Lt 5th MTP jt to decrease pressure off of wound   Time 4   Period Weeks   Status New   PT LONG TERM GOAL #2   Title Pt wound depth to be no greater than .5 cm to allow pt to care for wound at home    Time 6   Period Weeks   Status New   PT LONG TERM GOAL #3   Title Pt to be able to don supportive shoes without feeling increased pressure on her left foot.              Plan - October 28, 2015 1232    Clinical Impression Statement see above   Rehab Potential Good   PT Frequency 2x / week   PT Duration 6 weeks   PT Treatment/Interventions Patient/family education;ADLs/Self Care Home Management;Other (comment)  pulse lavage, debridement   PT Next Visit Plan begin pulse lavage, continue debridement and dressing change.     Consulted and Agree with Plan of Care Patient      Patient will benefit from skilled therapeutic intervention in order to improve the following deficits and impairments:  Increased edema, Other (comment)  Visit Diagnosis: Diabetes mellitus due to underlying condition with diabetic neuropathy, without long-term current use of insulin (HCC)  Open wound of left foot, subsequent encounter      G-Codes - 10-28-15 1243    Functional Assessment Tool Used depth of wound    Functional Limitation Other PT primary   Other PT Primary Current Status (Z6109) At least 40 percent but less than 60 percent impaired, limited or restricted   Other PT Primary Goal Status  (U0454) At least 1 percent but less than 20 percent impaired, limited or restricted      Problem List Patient Active Problem List   Diagnosis Date Noted  . Upper airway cough syndrome 06/26/2013  . DIABETES MELLITUS, TYPE II 01/31/2010  .  HYPERLIPIDEMIA 01/31/2010  . OBESITY 01/31/2010  . HYPERTENSION 01/31/2010    Virgina Organ, PT CLT [phone] 10/27/2015, 12:43 PM   [facility] [address] [place], [place], [address] Phone: [phone]   Fax:  [phone]  Name: Shelly Bauer MRN: [id number] Date of Birth: [birth year]

## 2015-10-31 ENCOUNTER — Ambulatory Visit (INDEPENDENT_AMBULATORY_CARE_PROVIDER_SITE_OTHER): Payer: Medicare HMO | Admitting: Internal Medicine

## 2015-10-31 ENCOUNTER — Encounter: Payer: Self-pay | Admitting: Internal Medicine

## 2015-10-31 VITALS — BP 168/90 | HR 107 | Ht 59.0 in | Wt 130.0 lb

## 2015-10-31 DIAGNOSIS — I10 Essential (primary) hypertension: Secondary | ICD-10-CM | POA: Diagnosis not present

## 2015-10-31 DIAGNOSIS — R058 Other specified cough: Secondary | ICD-10-CM

## 2015-10-31 DIAGNOSIS — R05 Cough: Secondary | ICD-10-CM | POA: Diagnosis not present

## 2015-10-31 MED ORDER — VALSARTAN-HYDROCHLOROTHIAZIDE 160-25 MG PO TABS: 1.0000 | ORAL_TABLET | Freq: Every day | ORAL | 2 refills | Status: DC

## 2015-10-31 NOTE — Progress Notes (Signed)
Subjective:    Patient ID: Shelly Bauer, female    DOB: [birth year]   MRN: [id number]   Brief patient profile:  35 yowf never smoker with onset of ? [place] 49s [place] need [place] maint rx then 2002 returned to [place] sev times a year spring / fall  a cough seemed to get better with albuterol only for short courses and "did fine" between flares  until Dec 2014 recurrent cough / sob seemed better with streroids/ levaquin but then cough recurred 06/27/13 and self referred 06/25/2013 to pulmonary clinic.   History of Present Illness  06/25/2013 1st [facility] office visit/ Wert on ACEi Chief Complaint  Patient presents with  . Pulmonary Consult    Self referral- pt c/o cough x 1 wk-non prod and worse with exertion.    got ? Steroid shot one week prior to OV  And some better on day of ov but cough was quite severe assoc with sensation of pnds but no excess mucus, cough day > night, assoc with sense of nasal and throat congestion rec Try off lisinopril Valsartan 160/25 one daily  X 6 weeks and if you start coughing again go ahead and take the tessilon GERD diet  F/u if not 100% better     09/13/2014 f/u ov/Wert re: recurrent cough x one week  Chief Complaint  Patient presents with  . Follow-up    Pt c/o dry cough with burning sensation in chest. Wheezing. Denies any SOB, chest congestion.   cough had completely resolved then onset was acute, sever, worse  day > noct / has not tried  tessilon because cough  goes away quickly  Assoc with overt HB sympt and subjective wheeze rec Any time you start coughing for any reason go ahead and tessilon and Try over the counter prilosec 20mg   Take 30-60 min before first meal of the day and Pepcid (famotidine) 20 mg one bedtime until cough is completely gone for at least a week without the need for cough suppression GERD diet  Prednisone 10 mg take  4 each am x 2 days,   2 each am x 2 days,  1 each am x 2 days and stop      10/31/2015  f/u ov/Wert re: hbp/ tendency to uacs on no gerd  Chief Complaint  Patient presents with  . Follow-up    Pt states feeling "great" and denies any new co's today.   Not limited by breathing from desired activities Rarely coughs at all anymore  No obvious day to day or daytime variability or assoc cp or chest tightness, subjective wheeze or overt sinus or hb symptoms. No unusual exp hx or h/o childhood pna/ asthma or knowledge of premature birth.  Sleeping ok without nocturnal  or early am exacerbation  of respiratory  c/o's or need for noct saba. Also denies any obvious fluctuation of symptoms with weather or environmental changes or other aggravating or alleviating factors except as outlined above   Current Medications, Allergies, Complete Past Medical History, Past Surgical History, Family History, and Social History were reviewed in [employer] record.  ROS  The following are not active complaints unless bolded sore throat, dysphagia, dental problems, itching, sneezing,  nasal congestion or excess/ purulent secretions, ear ache,   fever, chills, sweats, unintended wt loss, classically pleuritic or exertional cp, hemoptysis,  orthopnea pnd or leg swelling, presyncope, palpitations, abdominal pain, anorexia, nausea, vomiting, diarrhea  or  change in bowel or bladder habits, change in stools or urine, dysuria,hematuria,  rash, arthralgias, visual complaints, headache, numbness, weakness or ataxia or problems with walking or coordination,  change in mood/affect or memory.                         Objective:   Physical Exam   10/31/2015       130  09/13/14 143 lb (64.864 kg)  06/25/13 143 lb (64.864 kg)  01/31/10 183 lb (83.008 kg)    Vital signs reviewed - noted bp up  But did not take med this am     HEENT: nl dentition, turbinates, and orophanx. Nl external ear canals without cough reflex   NECK :  without JVD/Nodes/TM/ nl carotid upstrokes  bilaterally   LUNGS: no acc muscle use, clear to A and P bilaterally without cough on insp or exp maneuvers   CV:  RRR  no s3 or murmur or increase in P2, no edema   ABD:  soft and nontender with nl excursion in the supine position. No bruits or organomegaly, bowel sounds nl  MS:  warm without deformities, calf tenderness, cyanosis or clubbing  SKIN: warm and dry without lesions   - L foot in bandage   NEURO:  alert, approp, no deficits               Assessment & Plan:

## 2015-10-31 NOTE — Assessment & Plan Note (Signed)
When she takes her med Adequate control on present rx, reviewed > no change in rx needed    Although even in retrospect it may not be clear the ACEi contributed to the pt's symptoms,  Pt improved off them and adding them back at this point or in the future would risk confusion in interpretation of non-specific respiratory symptoms to which this patient is prone  ie  Better not to muddy the waters here.   rec 3 m rx then needs to return to primary care for f/u and refills

## 2015-10-31 NOTE — Patient Instructions (Addendum)
Any time you cough remember:  Try prilosec otc 20mg   Take 30-60 min before first meal of the day and Pepcid ac (famotidine) 20 mg one @  bedtime until cough is completely gone for at least a week without the need for cough suppression     Best cough medication is delsym 2 tsp every 12 hours as needed    For drainage / throat tickle try take CHLORPHENIRAMINE  4 mg - take one every 4 hours as needed - available over the counter- may cause drowsiness so start with just a bedtime dose or two and see how you tolerate it before trying in daytime     If you are satisfied with your treatment plan,  let your doctor know and he/she can either refill your medications or you can return here when your prescription runs out (within 3 months)   If in any way you are not 100% satisfied,  please tell us.  If 100% better, tell your friends!  Pulmonary follow up is as needed

## 2015-10-31 NOTE — Assessment & Plan Note (Signed)
Trial off acei 06/26/2013 > resolved  I had an extended final summary discussion with the patient reviewing all relevant studies completed to date and  lasting 15 to 20 minutes of a 25 minute visit on the following issues:    She def has tendency to  Upper airway cough syndrome, so named because it's frequently impossible to sort out how much is  CR/sinusitis with freq throat clearing (which can be related to primary GERD)   vs  causing  secondary (" extra esophageal")  GERD from wide swings in gastric pressure that occur with throat clearing, often  promoting self use of mint and menthol lozenges that reduce the lower esophageal sphincter tone and exacerbate the problem further in a cyclical fashion.   These are the same pts (now being labeled as having "irritable larynx syndrome" by some cough centers) who not infrequently have a history of having failed to tolerate ace inhibitors,  dry powder inhalers or biphosphonates or report having atypical reflux symptoms that don't respond to standard doses of PPI , and are easily confused as having aecopd or asthma flares by even experienced allergists/ pulmonologists.   In event of flare first steps are max rx for gerd and 1st gen h1, then f/u here prn

## 2015-11-01 ENCOUNTER — Ambulatory Visit (HOSPITAL_COMMUNITY): Payer: Medicare HMO | Admitting: Physical Therapy

## 2015-11-01 DIAGNOSIS — S91302D Unspecified open wound, left foot, subsequent encounter: Secondary | ICD-10-CM

## 2015-11-01 DIAGNOSIS — E0840 Diabetes mellitus due to underlying condition with diabetic neuropathy, unspecified: Secondary | ICD-10-CM

## 2015-11-01 NOTE — Therapy (Signed)
[facility] [address] [place], [place], [address] Phone: [phone]   Fax:  [phone]  Wound Care Therapy  Patient Details  Name: Shelly Bauer MRN: [id number] Date of Birth: [birth year] No Data Recorded  Encounter Date: 11/01/2015      PT End of Session - 11/01/15 1105    Visit Number 2   Number of Visits 12   Date for PT Re-Evaluation 11/26/15   Authorization Type A[employer]na medicare   Authorization - Visit Number 2   Authorization - Number of Visits 12   PT Start Time 1015   PT Stop Time 1050   PT Time Calculation (min) 35 min   Activity Tolerance Patient tolerated treatment well   Behavior During Therapy [facility] for tasks assessed/performed      Past Medical History:  Diagnosis Date  . Hypertension   . Stroke [facility])     No past surgical history on file.  There were no vitals filed for this visit.                  Wound Therapy - 11/01/15 1059    Subjective Pt states that she has been trying to wash and apply the antibiotic ointment to her wound as instructed.     Patient and Family Stated Goals wound to heal    Date of Onset 10/06/15   Prior Treatments antibiotic; self care with peroxide   Evaluation and Treatment Procedures Explained to Patient/Family Yes   Evaluation and Treatment Procedures agreed to   Lt MTP wound .8x.5 depth at least 2.0 cm. halo callous 2.0   Wound Properties Date First Assessed: 10/27/15 Time First Assessed: 0950 Wound Type: Puncture;Diabetic ulcer Location: Foot Location Orientation: Left Wound Description (Comments): plantar aspect of 5th MTP   Dressing Status None   Site / Wound Assessment Pale   % Wound base Red or Granulating 10%   % Wound base Yellow 90%   Peri-wound Assessment Edema;Other (Comment)  callous halo   Drainage Amount Minimal   Drainage Description Serous   Treatment Cleansed;Debridement (Selective);Hydrotherapy (Pulse lavage)   Pulsed lavage therapy - wound  location plantar aspect of 5th MTP   Pulsed Lavage with Suction (psi) 4 psi   Pulsed Lavage with Suction - Normal Saline Used 1000 mL   Pulsed Lavage Tip Tip with splash shield   Selective Debridement - Location callous   Selective Debridement - Tools Used Forceps;Scissors   Selective Debridement - Tissue Removed slough and callous    Wound Therapy - Clinical Statement Significant callous was able to be removed today.  Wound continues to have slough and wound bed is pale.  Therapist explained to pt that it would be beneficial for her to obtain an ABI to make sure that her arterial flow is sufficient.; (Pt feet are also cool to palpation).  Pt has a lot going on as mother died last week, her good friend died this week and her husband needs a revision of his heart valve.  States she will get to this as soon as she is able but it is not priority at this time.    Wound Therapy - Functional Problem List unable to wear majority of her shoes    Factors Delaying/Impairing Wound Healing Altered sensation;Diabetes Mellitus;Infection - systemic/local   Hydrotherapy Plan Debridement;Dressing change;Patient/family education;Pulsatile lavage with suction   Wound Therapy - Frequency --  2x a week x 6 weeks    Wound Therapy - Current Recommendations PT  Wound Therapy - Follow Up Recommendations Other (comment)   Wound Plan Pt to begin pulse lavage next treatment with therapist retracting wound to attempt to achieve deeper cleansing.  Debridment of callous followed by dressing using medihoney, 2x2 and kling.  Followed by netting to hold dressing in place.    Dressing  as above                    PT Short Term Goals - 11/01/15 1106      PT SHORT TERM GOAL #1   Title Pt to be able to verbalize proper self care for wound    Time 1   Period Weeks   Status Achieved     PT SHORT TERM GOAL #2   Title Wound depth to decrease by 1cm    Time 3   Period Weeks   Status On-going     PT SHORT TERM  GOAL #3   Title Pt to verbalize the importance of keeping blood sugars at a normal level and increase uptake of protien in the wound healing process.    Time 3   Period Weeks   Status On-going           PT Long Term Goals - 11/01/15 1106      PT LONG TERM GOAL #1   Title Pt to no longer have a callous on the plantar aspect of her Lt 5th MTP jt to decrease pressure off of wound   Time 4   Period Weeks   Status On-going     PT LONG TERM GOAL #2   Title Pt wound depth to be no greater than .5 cm to allow pt to care for wound at home    Time 6   Period Weeks   Status On-going     PT LONG TERM GOAL #3   Title Pt to be able to don supportive shoes without feeling increased pressure on her left foot.   Status On-going               Plan - 11/01/15 1105    Clinical Impression Statement see above   Rehab Potential Good   PT Frequency 2x / week   PT Duration 6 weeks   PT Treatment/Interventions Patient/family education;ADLs/Self Care Home Management;Other (comment)  pulse lavage, debridement   PT Next Visit Plan  pulse lavage, continue debridement and dressing change.     Consulted and Agree with Plan of Care Patient      Patient will benefit from skilled therapeutic intervention in order to improve the following deficits and impairments:  Increased edema, Other (comment)  Visit Diagnosis: Diabetes mellitus due to underlying condition with diabetic neuropathy, without long-term current use of insulin (HCC)  Open wound of left foot, subsequent encounter     Problem List Patient Active Problem List   Diagnosis Date Noted  . Upper airway cough syndrome 06/26/2013  . DIABETES MELLITUS, TYPE II 01/31/2010  . HYPERLIPIDEMIA 01/31/2010  . OBESITY 01/31/2010  . Essential hypertension 01/31/2010    Virgina Organ, PT CLT [phone] 11/01/2015, 11:07 AM  [facility] [address] [place], [place], [address] Phone:  [phone]   Fax:  [phone]  Name: Shelly Bauer MRN: [id number] Date of Birth: [birth year]

## 2015-11-03 ENCOUNTER — Telehealth (HOSPITAL_COMMUNITY): Payer: Self-pay

## 2015-11-03 ENCOUNTER — Ambulatory Visit (HOSPITAL_COMMUNITY): Payer: Medicare HMO | Admitting: Physical Therapy

## 2015-11-03 NOTE — Addendum Note (Signed)
Addended by: Bella Kennedy on: 11/03/2015 09:26 AM   Modules accepted: Orders

## 2015-11-07 DIAGNOSIS — Z01419 Encounter for gynecological examination (general) (routine) without abnormal findings: Secondary | ICD-10-CM | POA: Diagnosis not present

## 2015-11-08 ENCOUNTER — Ambulatory Visit (HOSPITAL_COMMUNITY): Payer: Medicare HMO | Attending: Family Medicine | Admitting: Physical Therapy

## 2015-11-08 DIAGNOSIS — X58XXXD Exposure to other specified factors, subsequent encounter: Secondary | ICD-10-CM | POA: Diagnosis not present

## 2015-11-08 DIAGNOSIS — E1152 Type 2 diabetes mellitus with diabetic peripheral angiopathy with gangrene: Secondary | ICD-10-CM | POA: Insufficient documentation

## 2015-11-08 DIAGNOSIS — E0840 Diabetes mellitus due to underlying condition with diabetic neuropathy, unspecified: Secondary | ICD-10-CM | POA: Diagnosis not present

## 2015-11-08 DIAGNOSIS — S91302D Unspecified open wound, left foot, subsequent encounter: Secondary | ICD-10-CM | POA: Diagnosis not present

## 2015-11-08 NOTE — Therapy (Signed)
[facility] [address] [place], [place], [address] Phone: [phone]   Fax:  [phone]  Wound Care Therapy  Patient Details  Name: Shelly Bauer MRN: [id number] Date of Birth: 1945/11/13 No Data Recorded  Encounter Date: 11/08/2015    Past Medical History:  Diagnosis Date  . Hypertension   . Stroke [facility])     No past surgical history on file.  There were no vitals filed for this visit.                  Wound Therapy - 11/08/15 1357    Subjective PT states she's been washing her wound 2X daily and reapplying neosporin.  States the bandage put on here will not stay in place and she's been using a tape bandage that works better.  PT reports no pain currently.  Leaving for [facility] today and will not be back until next week.    Patient and Family Stated Goals wound to heal    Date of Onset 10/06/15   Prior Treatments antibiotic; self care with peroxide   Evaluation and Treatment Procedures --   Lt MTP wound .8x.5 depth at least 2.0 cm. halo callous 2.0   Wound Properties Date First Assessed: 10/27/15 Time First Assessed: 0950 Wound Type: Puncture;Diabetic ulcer Location: Foot Location Orientation: Left Wound Description (Comments): plantar aspect of 5th MTP   Dressing Type Tape dressing   Dressing Changed Changed   Dressing Status Intact;Old drainage   Dressing Change Frequency Every other day   Site / Wound Assessment Pale   % Wound base Red or Granulating 10%   % Wound base Yellow 90%   Peri-wound Assessment Other (Comment)  callous halo   Undermining (cm) perimeter of wound   Margins Unattached edges (unapproximated)   Drainage Amount Minimal   Drainage Description Serous   Treatment Hydrotherapy (Pulse lavage);Debridement (Selective)   Pulsed lavage therapy - wound location plantar aspect of 5th MTP   Pulsed Lavage with Suction (psi) 4 psi   Pulsed Lavage with Suction - Normal Saline Used 1000 mL   Pulsed  Lavage Tip Tip with splash shield   Selective Debridement - Location wound and perimeter   Selective Debridement - Tools Used Forceps;Scissors   Selective Debridement - Tissue Removed slough and callous    Wound Therapy - Clinical Statement Unsure of depth of undermining beneath calloused region, however skin is blanced encircling wound opening.  Debrided edges and irrigated wound bed well with pulsed lavage.  Used medihoney gel to pack into woundbed and secured with 2X2 and medipore tape.  #1 netting used over this to secure in place. Pt was given medihoney gel and extra nettings to take on her trip to use on her wound.  Pt instructed to only change every other day (opposed to 2X daily) and explained rationale to patient.  Pt reported overall comfort of dressings following woundcare.   Wound Therapy - Functional Problem List unable to wear majority of her shoes    Factors Delaying/Impairing Wound Healing Altered sensation;Diabetes Mellitus;Infection - systemic/local   Hydrotherapy Plan Debridement;Dressing change;Patient/family education;Pulsatile lavage with suction   Wound Therapy - Frequency --  2x a week x 6 weeks    Wound Therapy - Current Recommendations PT   Wound Therapy - Follow Up Recommendations Other (comment)   Wound Plan Continue pulsed lavage and dressings as appropriate.    Dressing  medihoney gel gauze packed into wound, 2X2, medipore tape and #1 netting  PT Short Term Goals - 11/01/15 1106      PT SHORT TERM GOAL #1   Title Pt to be able to verbalize proper self care for wound    Time 1   Period Weeks   Status Achieved     PT SHORT TERM GOAL #2   Title Wound depth to decrease by 1cm    Time 3   Period Weeks   Status On-going     PT SHORT TERM GOAL #3   Title Pt to verbalize the importance of keeping blood sugars at a normal level and increase uptake of protien in the wound healing process.    Time 3   Period Weeks   Status On-going            PT Long Term Goals - 11/01/15 1106      PT LONG TERM GOAL #1   Title Pt to no longer have a callous on the plantar aspect of her Lt 5th MTP jt to decrease pressure off of wound   Time 4   Period Weeks   Status On-going     PT LONG TERM GOAL #2   Title Pt wound depth to be no greater than .5 cm to allow pt to care for wound at home    Time 6   Period Weeks   Status On-going     PT LONG TERM GOAL #3   Title Pt to be able to don supportive shoes without feeling increased pressure on her left foot.   Status On-going             Patient will benefit from skilled therapeutic intervention in order to improve the following deficits and impairments:     Visit Diagnosis: Diabetes mellitus due to underlying condition with diabetic neuropathy, without long-term current use of insulin (HCC)  Open wound of left foot, subsequent encounter     Problem List Patient Active Problem List   Diagnosis Date Noted  . Upper airway cough syndrome 06/26/2013  . DIABETES MELLITUS, TYPE II 01/31/2010  . HYPERLIPIDEMIA 01/31/2010  . OBESITY 01/31/2010  . Essential hypertension 01/31/2010    Lurena Nida, PTA/CLT [phone]  11/08/2015, 2:08 PM  [facility] [address] [place], [place], [address] Phone: [phone]   Fax:  [phone]  Name: Shelly Bauer MRN: [id number] Date of Birth: 28-Jul-1945

## 2015-11-10 ENCOUNTER — Ambulatory Visit (HOSPITAL_COMMUNITY): Payer: Medicare HMO | Admitting: Physical Therapy

## 2015-11-11 NOTE — Telephone Encounter (Signed)
cx

## 2015-11-15 ENCOUNTER — Ambulatory Visit (HOSPITAL_COMMUNITY): Payer: Medicare HMO | Admitting: Physical Therapy

## 2015-11-15 DIAGNOSIS — X58XXXD Exposure to other specified factors, subsequent encounter: Secondary | ICD-10-CM | POA: Diagnosis not present

## 2015-11-15 DIAGNOSIS — S91302D Unspecified open wound, left foot, subsequent encounter: Secondary | ICD-10-CM | POA: Diagnosis not present

## 2015-11-15 DIAGNOSIS — E0840 Diabetes mellitus due to underlying condition with diabetic neuropathy, unspecified: Secondary | ICD-10-CM

## 2015-11-15 DIAGNOSIS — E1152 Type 2 diabetes mellitus with diabetic peripheral angiopathy with gangrene: Secondary | ICD-10-CM | POA: Diagnosis not present

## 2015-11-15 NOTE — Therapy (Signed)
[facility] [address] [place], [place], [address] Phone: [phone]   Fax:  [phone]  Wound Care Therapy  Patient Details  Name: Shelly Bauer MRN: [id number] Date of Birth: 10-Dec-1945 No Data Recorded  Encounter Date: 11/15/2015      PT End of Session - 11/15/15 1140    Visit Number 3   Number of Visits 12   Date for PT Re-Evaluation 11/26/15   Authorization Type [employer]na medicare   Authorization - Visit Number 3   Authorization - Number of Visits 12   PT Start Time [id number]   PT Stop Time 1035   PT Time Calculation (min) 40 min   Activity Tolerance Patient tolerated treatment well   Behavior During Therapy [facility] for tasks assessed/performed      Past Medical History:  Diagnosis Date  . Hypertension   . Stroke [facility](HCC)     No past surgical history on file.  There were no vitals filed for this visit.                  Wound Therapy - 11/15/15 1132    Subjective Pt states [place] [facility] [place] bit.  Pt states that she has no pain.    Patient and Family Stated Goals wound to heal    Date of Onset 10/06/15   Prior Treatments antibiotic; self care with peroxide   Evaluation and Treatment Procedures Explained to Patient/Family Yes   Evaluation and Treatment Procedures agreed to   Lt MTP wound .8x.5 depth at least 2.0 cm. halo callous 2.0   Wound Properties Date First Assessed: 10/27/15 Time First Assessed: 0950 Wound Type: Puncture;Diabetic ulcer Location: Foot Location Orientation: Left Wound Description (Comments): plantar aspect of 5th MTP   Dressing Type Tape dressing   Dressing Changed Changed   Dressing Status Intact;Old drainage   Dressing Change Frequency Every other day   Site / Wound Assessment Pale   % Wound base Red or Granulating 40%   % Wound base Yellow 60%   Peri-wound Assessment Other (Comment)  callous halo   Wound Depth (cm) --  continues to be at least 2 cm     Margins Unattached edges (unapproximated)   Drainage Amount Minimal   Drainage Description Serous   Treatment Cleansed;Debridement (Selective)   Pulsed lavage therapy - wound location --   Pulsed Lavage with Suction (psi) --   Pulsed Lavage with Suction - Normal Saline Used --   Pulsed Lavage Tip --   Selective Debridement - Location devitalized tissue and callous around wound    Selective Debridement - Tools Used Forceps;Scissors   Selective Debridement - Tissue Removed slough and callous    Wound Therapy - Clinical Statement Depth of wound continues to be at least 2 cm.  Therapist advises pt to get an x-ray of her foot.  Significant amount of callous was able to be debrided during this session.  Wound opening is so small at this time that pulse lavage was discontinued.  Once callous is fully removed pt may be discharged to self care.  [facility]    Wound Therapy - Functional Problem List unable to wear majority of her shoes    Factors Delaying/Impairing Wound Healing Altered sensation;Diabetes Mellitus;Infection - systemic/local   Hydrotherapy Plan Debridement;Dressing change;Patient/family education;Pulsatile lavage with suction   Wound Therapy - Frequency --  2x a week x 6 weeks    Wound  Therapy - Current Recommendations PT   Wound Therapy - Follow Up Recommendations Other (comment)   Wound Plan Continue pulsed lavage and dressings as appropriate.    Dressing  vaseline to periphery, 2x2 and kling                  PT Education - 11/15/15 1139    Education provided Yes   Education Details stay off area as much as possible    Person(s) Educated Patient   Methods Explanation   Comprehension Verbalized understanding          PT Short Term Goals - 11/15/15 1141      PT SHORT TERM GOAL #1   Title Pt to be able to verbalize proper self care for wound    Time 1   Period Weeks   Status Achieved     PT SHORT TERM GOAL #2   Title Wound depth to  decrease by 1cm    Time 3   Period Weeks   Status On-going     PT SHORT TERM GOAL #3   Title Pt to verbalize the importance of keeping blood sugars at a normal level and increase uptake of protien in the wound healing process.    Time 3   Period Weeks   Status On-going           PT Long Term Goals - 11/15/15 1141      PT LONG TERM GOAL #1   Title Pt to no longer have a callous on the plantar aspect of her Lt 5th MTP jt to decrease pressure off of wound   Time 4   Period Weeks   Status On-going     PT LONG TERM GOAL #2   Title Pt wound depth to be no greater than .5 cm to allow pt to care for wound at home    Time 6   Period Weeks   Status On-going     PT LONG TERM GOAL #3   Title Pt to be able to don supportive shoes without feeling increased pressure on her left foot.   Status On-going               Plan - 11/15/15 1141    Clinical Impression Statement see above    Rehab Potential Good   PT Frequency 2x / week   PT Duration 6 weeks   PT Treatment/Interventions Patient/family education;ADLs/Self Care Home Management;Other (comment)  pulse lavage, debridement   PT Next Visit Plan discharge pulse lavage, continue debridement and dressing change.     Consulted and Agree with Plan of Care Patient      Patient will benefit from skilled therapeutic intervention in order to improve the following deficits and impairments:  Increased edema, Other (comment)  Visit Diagnosis: Diabetes mellitus due to underlying condition with diabetic neuropathy, without long-term current use of insulin (HCC)  Open wound of left foot, subsequent encounter     Problem List Patient Active Problem List   Diagnosis Date Noted  . Upper airway cough syndrome 06/26/2013  . DIABETES MELLITUS, TYPE II 01/31/2010  . HYPERLIPIDEMIA 01/31/2010  . OBESITY 01/31/2010  . Essential hypertension 01/31/2010    Virgina Organ, PT CLT [phone] 11/15/2015, 11:43 AM  [facility] [address] [place], [place], [address] Phone: [phone]   Fax:  [phone]  Name: Shelly Bauer MRN: [id number] Date of Birth: 1945-05-16

## 2015-11-17 ENCOUNTER — Ambulatory Visit (HOSPITAL_COMMUNITY): Payer: Medicare HMO | Admitting: Physical Therapy

## 2015-11-17 DIAGNOSIS — E0840 Diabetes mellitus due to underlying condition with diabetic neuropathy, unspecified: Secondary | ICD-10-CM

## 2015-11-17 DIAGNOSIS — S91302D Unspecified open wound, left foot, subsequent encounter: Secondary | ICD-10-CM | POA: Diagnosis not present

## 2015-11-17 DIAGNOSIS — X58XXXD Exposure to other specified factors, subsequent encounter: Secondary | ICD-10-CM | POA: Diagnosis not present

## 2015-11-17 DIAGNOSIS — E1152 Type 2 diabetes mellitus with diabetic peripheral angiopathy with gangrene: Secondary | ICD-10-CM | POA: Diagnosis not present

## 2015-11-17 NOTE — Therapy (Signed)
[facility] [address] [place], [place], [address] Phone: [phone]   Fax:  [phone]  Wound Care Therapy  Patient Details  Name: TREANNA DUMLER MRN: [id number] Date of Birth: [birth year] No Data Recorded  Encounter Date: 11/17/2015      PT End of Session - 11/17/15 1654    Visit Number 4   Number of Visits 12   Date for PT Re-Evaluation 11/26/15   Authorization Type [employer]na medicare   Authorization - Visit Number 4   Authorization - Number of Visits 12   PT Start Time 1030   PT Stop Time 1055   PT Time Calculation (min) 25 min   Activity Tolerance Patient tolerated treatment well   Behavior During Therapy [facility] for tasks assessed/performed      Past Medical History:  Diagnosis Date  . Hypertension   . Stroke [facility])     No past surgical history on file.  There were no vitals filed for this visit.                  Wound Therapy - 11/17/15 1651    Subjective Pt states she is doing well today without pain.   Patient and Family Stated Goals wound to heal    Date of Onset 10/06/15   Prior Treatments antibiotic; self care with peroxide   Pain Assessment No/denies pain   Evaluation and Treatment Procedures --   Lt MTP wound .8x.5 depth at least 2.0 cm. halo callous 2.0   Wound Properties Date First Assessed: 10/27/15 Time First Assessed: 0950 Wound Type: Puncture;Diabetic ulcer Location: Foot Location Orientation: Left Wound Description (Comments): plantar aspect of 5th MTP   Dressing Type Tape dressing   Dressing Changed Changed   Dressing Status Intact;Old drainage   Dressing Change Frequency Every other day   Site / Wound Assessment Pale   % Wound base Red or Granulating 40%   % Wound base Yellow 60%  callous   Peri-wound Assessment Other (Comment)  callous halo   Margins Unattached edges (unapproximated)   Drainage Amount Minimal   Drainage Description Serous   Treatment Cleansed;Debridement  (Selective)   Selective Debridement - Location devitalized tissue and callous around wound    Selective Debridement - Tools Used Forceps;Scissors   Selective Debridement - Tissue Removed slough and callous    Wound Therapy - Clinical Statement Continued debridement of callous perimeter of wound to promote approximation.  Irrigated and repacked wound with medihoney gel.  Secured with medipore tape and netting.  PT reported overall comfort with bandage.    Wound Therapy - Functional Problem List unable to wear majority of her shoes    Factors Delaying/Impairing Wound Healing Altered sensation;Diabetes Mellitus;Infection - systemic/local   Hydrotherapy Plan Debridement;Dressing change;Patient/family education;Pulsatile lavage with suction   Wound Therapy - Frequency --  2x a week x 6 weeks    Wound Therapy - Current Recommendations PT   Wound Therapy - Follow Up Recommendations Other (comment)   Wound Plan continue debridement and dressing changes as appropriate.   Dressing  medihoney gel gauze packed into wound, vaseline to periphery, 2x2 and kling                    PT Short Term Goals - 11/15/15 1141      PT SHORT TERM GOAL #1   Title Pt to be able to verbalize proper self care for wound    Time 1   Period Weeks  Status Achieved     PT SHORT TERM GOAL #2   Title Wound depth to decrease by 1cm    Time 3   Period Weeks   Status On-going     PT SHORT TERM GOAL #3   Title Pt to verbalize the importance of keeping blood sugars at a normal level and increase uptake of protien in the wound healing process.    Time 3   Period Weeks   Status On-going           PT Long Term Goals - 11/15/15 1141      PT LONG TERM GOAL #1   Title Pt to no longer have a callous on the plantar aspect of her Lt 5th MTP jt to decrease pressure off of wound   Time 4   Period Weeks   Status On-going     PT LONG TERM GOAL #2   Title Pt wound depth to be no greater than .5 cm to allow pt  to care for wound at home    Time 6   Period Weeks   Status On-going     PT LONG TERM GOAL #3   Title Pt to be able to don supportive shoes without feeling increased pressure on her left foot.   Status On-going             Patient will benefit from skilled therapeutic intervention in order to improve the following deficits and impairments:     Visit Diagnosis: Diabetes mellitus due to underlying condition with diabetic neuropathy, without long-term current use of insulin (HCC)  Open wound of left foot, subsequent encounter     Problem List Patient Active Problem List   Diagnosis Date Noted  . Upper airway cough syndrome 06/26/2013  . DIABETES MELLITUS, TYPE II 01/31/2010  . HYPERLIPIDEMIA 01/31/2010  . OBESITY 01/31/2010  . Essential hypertension 01/31/2010    Lurena Nidamy B Frazier, PTA/CLT [phone] 11/17/2015, 4:56 PM  [facility] [address] [place], [place], [address] Phone: [phone]   Fax:  [phone]  Name: San JettyLinda W Meath MRN: [id number] Date of Birth: 12-11-1945

## 2015-11-22 ENCOUNTER — Ambulatory Visit (HOSPITAL_COMMUNITY): Payer: Medicare HMO | Admitting: Physical Therapy

## 2015-11-22 DIAGNOSIS — E1152 Type 2 diabetes mellitus with diabetic peripheral angiopathy with gangrene: Secondary | ICD-10-CM

## 2015-11-22 DIAGNOSIS — S91302D Unspecified open wound, left foot, subsequent encounter: Secondary | ICD-10-CM | POA: Diagnosis not present

## 2015-11-22 DIAGNOSIS — X58XXXD Exposure to other specified factors, subsequent encounter: Secondary | ICD-10-CM | POA: Diagnosis not present

## 2015-11-22 DIAGNOSIS — E0840 Diabetes mellitus due to underlying condition with diabetic neuropathy, unspecified: Secondary | ICD-10-CM | POA: Diagnosis not present

## 2015-11-22 NOTE — Therapy (Signed)
[facility] [address] [place], [place], [address] Phone: [phone]   Fax:  [phone]  Wound Care Therapy Reassessment Patient Details  Name: Shelly Bauer MRN: [id number] Date of Birth: 01/23/1946 No Data Recorded  Encounter Date: 11/22/2015      PT End of Session - 11/22/15 1109    Visit Number 5   Number of Visits 9   Date for PT Re-Evaluation 12/22/15   Authorization Type [employer] medicare   Authorization - Visit Number 5   Authorization - Number of Visits 9   PT Start Time [phone]   PT Stop Time 1027   PT Time Calculation (min) 32 min   Activity Tolerance Patient tolerated treatment well   Behavior During Therapy [facility] for tasks assessed/performed      Past Medical History:  Diagnosis Date  . Hypertension   . Stroke [facility])     No past surgical history on file.  There were no vitals filed for this visit.                  Wound Therapy - 11/22/15 1102    Subjective Pt has been putting neosporin on her wound.  She has no pain.    Patient and Family Stated Goals wound to heal    Date of Onset 10/06/15   Prior Treatments antibiotic; self care with peroxide   Pain Assessment No/denies pain   Evaluation and Treatment Procedures Explained to Patient/Family Yes   Evaluation and Treatment Procedures agreed to   Lt MTP wound .8x.5 depth at least 2.0 cm. halo callous 2.0   Wound Properties Date First Assessed: 10/27/15 Time First Assessed: 0950 Wound Type: Puncture;Diabetic ulcer Location: Foot Location Orientation: Left Wound Description (Comments): plantar aspect of 5th MTP   Dressing Type Tape dressing   Dressing Changed Changed   Dressing Status Intact;Old drainage   Dressing Change Frequency PRN   Site / Wound Assessment Pale   % Wound base Red or Granulating 70%  was 10%   % Wound base Yellow 30%  was 90%   Peri-wound Assessment Other (Comment)  callous halo significanty decreased.    Wound Length (cm)  0.3 cm  was .8   Wound Width (cm) 0.4 cm  was .5   Wound Depth (cm) 1.4 cm  was at least 2 cm    Undermining (cm) slight around edges of callous    Margins Unattached edges (unapproximated)   Drainage Amount Minimal   Drainage Description Serous   Treatment Cleansed;Debridement (Selective)   Selective Debridement - Location devitalized tissue and callous around wound    Selective Debridement - Tools Used Forceps;Scissors   Selective Debridement - Tissue Removed slough and callous    Wound Therapy - Clinical Statement Debrided epiboled edges as well as callous from wound.  Wound is decreasing in size but due to significant depth will continue to need skilled care.  Pt will be decreased to one time a week however.     Wound Therapy - Functional Problem List unable to wear majority of her shoes    Factors Delaying/Impairing Wound Healing Altered sensation;Diabetes Mellitus;Infection - systemic/local   Hydrotherapy Plan Debridement;Dressing change;Patient/family education;Pulsatile lavage with suction   Wound Therapy - Frequency --  change to one time a week for the next four weeks    Wound Therapy - Current Recommendations PT   Wound Therapy - Follow Up Recommendations Other (comment)   Wound Plan continue debridement and dressing changes as appropriate.  Dressing  medihoney gel gauze packed into wound, vaseline to periphery, 2x2 and kling                    PT Short Term Goals - 11/22/15 1113      PT SHORT TERM GOAL #1   Title Pt to be able to verbalize proper self care for wound    Time 1   Period Weeks   Status Achieved     PT SHORT TERM GOAL #2   Title Wound depth to decrease by 1cm    Time 3   Period Weeks   Status On-going     PT SHORT TERM GOAL #3   Title Pt to verbalize the importance of keeping blood sugars at a normal level and increase uptake of protien in the wound healing process.    Time 3   Period Weeks   Status Achieved           PT Long  Term Goals - 11/22/15 1113      PT LONG TERM GOAL #1   Title Pt to no longer have a callous on the plantar aspect of her Lt 5th MTP jt to decrease pressure off of wound   Time 4   Period Weeks   Status Partially Met     PT LONG TERM GOAL #2   Title Pt wound depth to be no greater than .5 cm to allow pt to care for wound at home    Time 6   Period Weeks   Status On-going     PT LONG TERM GOAL #3   Title Pt to be able to don supportive shoes without feeling increased pressure on her left foot.   Status On-going               Plan - 11/22/15 1111    Clinical Impression Statement see above    Rehab Potential Good   PT Frequency 1x / week   PT Duration 4 weeks  for an additional 4 weeks for a total of 8 weeks    PT Treatment/Interventions Patient/family education;ADLs/Self Care Home Management;Other (comment)  pulse lavage, debridement   PT Next Visit Plan , continue debridement and dressing change decrease to one time a week     Consulted and Agree with Plan of Care Patient      Patient will benefit from skilled therapeutic intervention in order to improve the following deficits and impairments:  Increased edema, Other (comment)  Visit Diagnosis: Diabetic gangrene [facility])     Problem List Patient Active Problem List   Diagnosis Date Noted  . Upper airway cough syndrome 06/26/2013  . DIABETES MELLITUS, TYPE II 01/31/2010  . HYPERLIPIDEMIA 01/31/2010  . OBESITY 01/31/2010  . Essential hypertension 01/31/2010   Rayetta Humphrey, PT CLT [phone] 11/22/2015, 11:20 AM  [facility] [address] [place], [place], [address] Phone: [phone]   Fax:  [phone]  Name: Shelly Bauer MRN: [id number] Date of Birth: 1945-12-06

## 2015-11-24 ENCOUNTER — Ambulatory Visit (HOSPITAL_COMMUNITY): Payer: Medicare HMO | Admitting: Physical Therapy

## 2015-12-01 ENCOUNTER — Ambulatory Visit (HOSPITAL_COMMUNITY): Payer: Medicare HMO | Admitting: Physical Therapy

## 2015-12-01 DIAGNOSIS — E1152 Type 2 diabetes mellitus with diabetic peripheral angiopathy with gangrene: Secondary | ICD-10-CM

## 2015-12-01 DIAGNOSIS — E0840 Diabetes mellitus due to underlying condition with diabetic neuropathy, unspecified: Secondary | ICD-10-CM

## 2015-12-01 DIAGNOSIS — X58XXXD Exposure to other specified factors, subsequent encounter: Secondary | ICD-10-CM | POA: Diagnosis not present

## 2015-12-01 DIAGNOSIS — S91302D Unspecified open wound, left foot, subsequent encounter: Secondary | ICD-10-CM | POA: Diagnosis not present

## 2015-12-01 NOTE — Therapy (Signed)
[facility] [address] [place], [place], [address] Phone: [phone]   Fax:  [phone]  Wound Care Therapy  Patient Details  Name: Shelly Bauer MRN: [id number] Date of Birth: 07/24/1945 No Data Recorded  Encounter Date: 12/01/2015      PT End of Session - 12/01/15 1142    Visit Number 6   Number of Visits 9   Date for PT Re-Evaluation 12/22/15   Authorization Type [employer] medicare   Authorization - Visit Number 6   Authorization - Number of Visits 9   PT Start Time 1030   PT Stop Time 1130   PT Time Calculation (min) 60 min   Activity Tolerance Patient tolerated treatment well   Behavior During Therapy [facility] for tasks assessed/performed      Past Medical History:  Diagnosis Date  . Hypertension   . Stroke [facility])     No past surgical history on file.  There were no vitals filed for this visit.                  Wound Therapy - 12/01/15 1135    Subjective Pt states that she has been cleansing and dressing her wound daily sometimes twice a day.     Patient and Family Stated Goals wound to heal    Date of Onset 10/06/15   Prior Treatments antibiotic; self care with peroxide   Pain Assessment No/denies pain   Evaluation and Treatment Procedures Explained to Patient/Family Yes   Evaluation and Treatment Procedures agreed to   Lt MTP wound .8x.5 depth at least 2.0 cm. halo callous 2.0   Wound Properties Date First Assessed: 10/27/15 Time First Assessed: 0950 Wound Type: Puncture;Diabetic ulcer Location: Foot Location Orientation: Left Wound Description (Comments): plantar aspect of 5th MTP   Dressing Type Tape dressing   Dressing Status Intact;Old drainage   Dressing Change Frequency PRN   Site / Wound Assessment Pale   % Wound base Red or Granulating 70%  was 10%   % Wound base Yellow 30%  was 90%   Peri-wound Assessment Other (Comment)  callous halo significanty decreased.    Wound Length (cm) 0.5 cm   was .3 increase is due to callous removal    Wound Width (cm) 0.4 cm  was .4   Wound Depth (cm) 1.2 cm  was 1.4 (approximate)   Undermining (cm) debrided most of undermining at this time    Margins Epibole (rolled edges)   Drainage Amount Minimal   Drainage Description Serous   Treatment Cleansed;Debridement (Selective)   Selective Debridement - Location devitalized tissue and callous around wound    Selective Debridement - Tools Used Forceps;Scissors   Selective Debridement - Tissue Removed epiboled edges, slough and callous    Wound Therapy - Clinical Statement Pt encouraged to see a vascular surgeon once again to check blood flow to area.  Therapist will order [facility] shoe for off loading of wound site due to minimal healing occuring.  Changed dressing from medihoney to silver hydrofiber to attempt to improve healing environment.    Wound Therapy - Functional Problem List unable to wear majority of her shoes    Factors Delaying/Impairing Wound Healing Altered sensation;Diabetes Mellitus;Infection - systemic/local   Hydrotherapy Plan Debridement;Dressing change;Patient/family education;Pulsatile lavage with suction   Wound Therapy - Frequency --  change to one time a week for the next four weeks    Wound Therapy - Current Recommendations PT   Wound Therapy - Follow  Up Recommendations Other (comment)   Wound Plan continue debridement and dressing changes as appropriate.   Dressing  silver hydrofiber, vaseline to edges , 2x2 kling and netting                  PT Education - 12/01/15 1141    Education provided Yes   Education Details To keep dressing on for 3 days if possible then cleanse and dress.  To try and keep weight off off wound site.  That therapist feels it would be beneficial to see a vascular surgeon.   Person(s) Educated Patient   Methods Explanation   Comprehension Verbalized understanding          PT Short Term Goals - 12/01/15 1142      PT SHORT TERM  GOAL #1   Title Pt to be able to verbalize proper self care for wound    Time 1   Period Weeks   Status Achieved     PT SHORT TERM GOAL #2   Title Wound depth to decrease by 1cm    Time 3   Period Weeks   Status On-going     PT SHORT TERM GOAL #3   Title Pt to verbalize the importance of keeping blood sugars at a normal level and increase uptake of protien in the wound healing process.    Time 3   Period Weeks   Status Achieved           PT Long Term Goals - 12/01/15 1143      PT LONG TERM GOAL #1   Title Pt to no longer have a callous on the plantar aspect of her Lt 5th MTP jt to decrease pressure off of wound   Time 4   Period Weeks   Status Partially Met     PT LONG TERM GOAL #2   Title Pt wound depth to be no greater than .5 cm to allow pt to care for wound at home    Time 6   Period Weeks   Status On-going     PT LONG TERM GOAL #3   Title Pt to be able to don supportive shoes without feeling increased pressure on her left foot.   Status On-going               Plan - 12/01/15 1142    Clinical Impression Statement see above    Rehab Potential Good   PT Frequency 1x / week   PT Duration 4 weeks  for an additional 4 weeks for a total of 8 weeks    PT Treatment/Interventions Patient/family education;ADLs/Self Care Home Management;Other (comment)  pulse lavage, debridement   PT Next Visit Plan , continue debridement and dressing change decrease to one time a week     Consulted and Agree with Plan of Care Patient      Patient will benefit from skilled therapeutic intervention in order to improve the following deficits and impairments:  Increased edema, Other (comment)  Visit Diagnosis: Diabetic gangrene ([place])  Diabetes mellitus due to underlying condition with diabetic neuropathy, without long-term current use of insulin [facility])     Problem List Patient Active Problem List   Diagnosis Date Noted  . Upper airway cough syndrome 06/26/2013  .  DIABETES MELLITUS, TYPE II 01/31/2010  . HYPERLIPIDEMIA 01/31/2010  . OBESITY 01/31/2010  . Essential hypertension 01/31/2010    Rayetta Humphrey, PT CLT [phone] 12/01/2015, 11:44 AM  [place] [address] [place], [place], [address] Phone: [phone]   Fax:  [phone]  Name: LINDYN VOSSLER MRN: [id number] Date of Birth: November 13, 1945

## 2015-12-06 ENCOUNTER — Ambulatory Visit (HOSPITAL_COMMUNITY): Payer: Medicare HMO | Admitting: Physical Therapy

## 2015-12-06 DIAGNOSIS — E0840 Diabetes mellitus due to underlying condition with diabetic neuropathy, unspecified: Secondary | ICD-10-CM | POA: Diagnosis not present

## 2015-12-06 DIAGNOSIS — E1152 Type 2 diabetes mellitus with diabetic peripheral angiopathy with gangrene: Secondary | ICD-10-CM | POA: Diagnosis not present

## 2015-12-06 DIAGNOSIS — S91302D Unspecified open wound, left foot, subsequent encounter: Secondary | ICD-10-CM | POA: Diagnosis not present

## 2015-12-06 DIAGNOSIS — X58XXXD Exposure to other specified factors, subsequent encounter: Secondary | ICD-10-CM | POA: Diagnosis not present

## 2015-12-06 NOTE — Therapy (Signed)
[place], [place], [address] Phone: [phone]   Fax:  [phone]  Wound Care Therapy  Patient Details  Name: Shelly Bauer MRN: [id number] Date of Birth: 1945-04-13 No Data Recorded  Encounter Date: 12/06/2015      PT End of Session - 12/06/15 1212    Visit Number 7   Number of Visits 9   Date for PT Re-Evaluation 12/22/15   Authorization Type [employer]na medicare   Authorization - Visit Number 7   Authorization - Number of Visits 9   PT Start Time [phone]   PT Stop Time 1035   PT Time Calculation (min) 53 min   Activity Tolerance Patient tolerated treatment well   Behavior During Therapy [facility] for tasks assessed/performed      Past Medical History:  Diagnosis Date  . Hypertension   . Stroke [facility])     No past surgical history on file.  There were no vitals filed for this visit.                  Wound Therapy - 12/06/15 1111    Subjective Pt continues to state that she is not going to the vascular surgeon ue to her husband having a lot of medical problems going on.  Pt is putting neosporin on her foot.   Patient and Family Stated Goals wound to heal    Date of Onset 10/06/15   Prior Treatments antibiotic; self care with peroxide   Evaluation and Treatment Procedures Explained to Patient/Family Yes   Evaluation and Treatment Procedures agreed to   Lt MTP wound .8x.5 depth at least 2.0 cm. halo callous 2.0   Wound Properties Date First Assessed: 10/27/15 Time First Assessed: 0950 Wound Type: Puncture;Diabetic ulcer Location: Foot Location Orientation: Left Wound Description (Comments): plantar aspect of 5th MTP   Dressing Type Tape dressing   Dressing Status Intact;Old drainage   Dressing Change Frequency PRN   Site / Wound Assessment Pale   % Wound base Red or Granulating 80%  after debridement    % Wound base Yellow 20%  after debridement    Peri-wound Assessment Maceration  callous halo  significanty decreased.    Wound Length (cm) 0.4 cm   Wound Width (cm) 0.4 cm   Wound Depth (cm) 1.5 cm   Undermining (cm) 1 cm  ant. and laterally, .5 cm posteriorly; .3 medially    Margins Epibole (rolled edges)   Drainage Amount Minimal   Drainage Description Serous   Treatment Cleansed;Debridement (Selective)   Selective Debridement - Location devitalized tissue and callous around wound    Selective Debridement - Tools Used Forceps;Scissors   Selective Debridement - Tissue Removed epiboled edges, slough and callous    Wound Therapy - Clinical Statement Darco boot ordered but not recieved.  Pt given silver hydrofiber to place on wound between therapy sessions.  Significant callous debrided.    Wound Therapy - Functional Problem List unable to wear majority of her shoes    Factors Delaying/Impairing Wound Healing Altered sensation;Diabetes Mellitus;Infection - systemic/local   Hydrotherapy Plan Debridement;Dressing change;Patient/family education;Pulsatile lavage with suction   Wound Therapy - Frequency --  change to one time a week for the next four weeks    Wound Therapy - Current Recommendations PT   Wound Therapy - Follow Up Recommendations Other (comment)   Wound Plan Encourage pt to see a vascular surgeon.  Continue debridement and dressing changes as appropriate.   Dressing  silver hydrofiber, vaseline to edges , 2x2 kling and netting                    PT Short Term Goals - 12/01/15 1142      PT SHORT TERM GOAL #1   Title Pt to be able to verbalize proper self care for wound    Time 1   Period Weeks   Status Achieved     PT SHORT TERM GOAL #2   Title Wound depth to decrease by 1cm    Time 3   Period Weeks   Status On-going     PT SHORT TERM GOAL #3   Title Pt to verbalize the importance of keeping blood sugars at a normal level and increase uptake of protien in the wound healing process.    Time 3   Period Weeks   Status Achieved           PT  Long Term Goals - 12/01/15 1143      PT LONG TERM GOAL #1   Title Pt to no longer have a callous on the plantar aspect of her Lt 5th MTP jt to decrease pressure off of wound   Time 4   Period Weeks   Status Partially Met     PT LONG TERM GOAL #2   Title Pt wound depth to be no greater than .5 cm to allow pt to care for wound at home    Time 6   Period Weeks   Status On-going     PT LONG TERM GOAL #3   Title Pt to be able to don supportive shoes without feeling increased pressure on her left foot.   Status On-going             Patient will benefit from skilled therapeutic intervention in order to improve the following deficits and impairments:     Visit Diagnosis: Type 2 diabetes mellitus with diabetic peripheral angiopathy and gangrene, without long-term current use of insulin [facility])     Problem List Patient Active Problem List   Diagnosis Date Noted  . Upper airway cough syndrome 06/26/2013  . DIABETES MELLITUS, TYPE II 01/31/2010  . HYPERLIPIDEMIA 01/31/2010  . OBESITY 01/31/2010  . Essential hypertension 01/31/2010  Rayetta Humphrey, PT CLT [phone] 12/06/2015, 12:13 PM  [facility] [address] [place], [place], [address] Phone: [phone]   Fax:  [phone]  Name: Shelly Bauer MRN: [id number] Date of Birth: [birth year]

## 2015-12-14 ENCOUNTER — Ambulatory Visit (HOSPITAL_COMMUNITY): Payer: Medicare HMO | Attending: Family Medicine | Admitting: Physical Therapy

## 2015-12-14 DIAGNOSIS — E0840 Diabetes mellitus due to underlying condition with diabetic neuropathy, unspecified: Secondary | ICD-10-CM | POA: Insufficient documentation

## 2015-12-14 DIAGNOSIS — S91302D Unspecified open wound, left foot, subsequent encounter: Secondary | ICD-10-CM | POA: Diagnosis not present

## 2015-12-14 DIAGNOSIS — E1152 Type 2 diabetes mellitus with diabetic peripheral angiopathy with gangrene: Secondary | ICD-10-CM

## 2015-12-14 NOTE — Therapy (Signed)
[place], [place], [address] Phone: [phone]   Fax:  [phone]  Wound Care Therapy  Patient Details  Name: Shelly Bauer MRN: [id number] Date of Birth: [birth year] No Data Recorded  Encounter Date: 12/14/2015      PT End of Session - 12/14/15 1017    Visit Number 8   Number of Visits 9   Date for PT Re-Evaluation 12/22/15   Authorization Type A[employer]na medicare   Authorization - Visit Number 8   Authorization - Number of Visits 9   PT Start Time [phone]   PT Stop Time 1012   PT Time Calculation (min) 24 min   Activity Tolerance Patient tolerated treatment well   Behavior During Therapy [facility] for tasks assessed/performed      Past Medical History:  Diagnosis Date  . Hypertension   . Stroke [facility])     No past surgical history on file.  There were no vitals filed for this visit.                  Wound Therapy - 12/14/15 1012    Subjective Pt reports she is changing her bandages when she needs to and using neosporin.  Pt reports no pain and feels her wound is now healed up.   Patient and Family Stated Goals wound to heal    Date of Onset 10/06/15   Prior Treatments antibiotic; self care with peroxide   Evaluation and Treatment Procedures Explained to Patient/Family Yes   Evaluation and Treatment Procedures agreed to   Lt MTP wound .8x.5 depth at least 2.0 cm. halo callous 2.0   Wound Properties Date First Assessed: 10/27/15 Time First Assessed: 0950 Wound Type: Puncture;Diabetic ulcer Location: Foot Location Orientation: Left Wound Description (Comments): plantar aspect of 5th MTP   Dressing Type Tape dressing   Dressing Status Intact;Old drainage   Dressing Change Frequency PRN   Site / Wound Assessment Pale   % Wound base Red or Granulating 80%  after debridement    % Wound base Yellow 20%  after debridement    Peri-wound Assessment Maceration  callous halo significanty decreased.    Wound Length (cm) 0.2 cm   Wound Width (cm) 0.2 cm   Wound Depth (cm) 1 cm   Margins Epibole (rolled edges)   Drainage Amount Minimal   Drainage Description Serous   Treatment Cleansed;Debridement (Selective)   Selective Debridement - Location devitalized tissue and callous around wound    Selective Debridement - Tools Used Forceps;Scissors   Selective Debridement - Tissue Removed epiboled edges, slough and callous    Wound Therapy - Clinical Statement Wound appeared to be healed, however when callous removed revealed still with small opening approx 0.2cm2 and at least 1 cm depth.  Cleansed and repacked with medihoney gel gauze.  Pt instructed to attempt to leave packing in and only change the outside bandaging.  Pt verbalized understanding.  Darco boot ordered by evaluating therapist still has not arrived to clinic at this point.  Pt continues to wear her crocs and reports overall comfort with these.    Wound Therapy - Functional Problem List unable to wear majority of her shoes    Factors Delaying/Impairing Wound Healing Altered sensation;Diabetes Mellitus;Infection - systemic/local   Hydrotherapy Plan Debridement;Dressing change;Patient/family education;Pulsatile lavage with suction   Wound Therapy - Frequency --  change to one time a week for the next four weeks    Wound Therapy - Current Recommendations PT  Wound Therapy - Follow Up Recommendations Other (comment)   Wound Plan Encourage pt to see a vascular surgeon.  Continue debridement and dressing changes as appropriate.   Dressing  medihoney gauze packed into wound, 2X2, medipore tape and #3 netting                   PT Short Term Goals - 12/01/15 1142      PT SHORT TERM GOAL #1   Title Pt to be able to verbalize proper self care for wound    Time 1   Period Weeks   Status Achieved     PT SHORT TERM GOAL #2   Title Wound depth to decrease by 1cm    Time 3   Period Weeks   Status On-going     PT SHORT TERM  GOAL #3   Title Pt to verbalize the importance of keeping blood sugars at a normal level and increase uptake of protien in the wound healing process.    Time 3   Period Weeks   Status Achieved           PT Long Term Goals - 12/01/15 1143      PT LONG TERM GOAL #1   Title Pt to no longer have a callous on the plantar aspect of her Lt 5th MTP jt to decrease pressure off of wound   Time 4   Period Weeks   Status Partially Met     PT LONG TERM GOAL #2   Title Pt wound depth to be no greater than .5 cm to allow pt to care for wound at home    Time 6   Period Weeks   Status On-going     PT LONG TERM GOAL #3   Title Pt to be able to don supportive shoes without feeling increased pressure on her left foot.   Status On-going             Patient will benefit from skilled therapeutic intervention in order to improve the following deficits and impairments:     Visit Diagnosis: Type 2 diabetes mellitus with diabetic peripheral angiopathy and gangrene, without long-term current use of insulin ([facility])  Diabetic gangrene ([place])  Diabetes mellitus due to underlying condition with diabetic neuropathy, without long-term current use of insulin ([facility])  Open wound of left foot, subsequent encounter     Problem List Patient Active Problem List   Diagnosis Date Noted  . Upper airway cough syndrome 06/26/2013  . DIABETES MELLITUS, TYPE II 01/31/2010  . HYPERLIPIDEMIA 01/31/2010  . OBESITY 01/31/2010  . Essential hypertension 01/31/2010    Teena Irani, PTA/CLT [phone]  12/14/2015, 10:18 AM  [facility] [address] [place], [place], [address] Phone: [phone]   Fax:  [phone]  Name: Shelly Bauer MRN: [id number] Date of Birth: January 09, 1946

## 2015-12-16 ENCOUNTER — Telehealth (HOSPITAL_COMMUNITY): Payer: Self-pay | Admitting: Physical Therapy

## 2015-12-16 NOTE — Telephone Encounter (Signed)
[facility], requested to come in on Monday instead. FN

## 2015-12-19 ENCOUNTER — Ambulatory Visit (HOSPITAL_COMMUNITY): Payer: Medicare HMO | Admitting: Physical Therapy

## 2015-12-19 DIAGNOSIS — E1152 Type 2 diabetes mellitus with diabetic peripheral angiopathy with gangrene: Secondary | ICD-10-CM | POA: Diagnosis not present

## 2015-12-19 DIAGNOSIS — S91302D Unspecified open wound, left foot, subsequent encounter: Secondary | ICD-10-CM

## 2015-12-19 DIAGNOSIS — E0840 Diabetes mellitus due to underlying condition with diabetic neuropathy, unspecified: Secondary | ICD-10-CM

## 2015-12-19 NOTE — Therapy (Addendum)
[place], [place], [address] Phone: [phone]   Fax:  [phone]  Wound Care Therapy  Patient Details  Name: Shelly Bauer MRN: [id number] Date of Birth: 18-Nov-1945 No Data Recorded  Encounter Date: 12/19/2015      PT End of Session - 12/19/15 1202    Visit Number 10   Number of Visits 19   Date for PT Re-Evaluation 12/22/15   Authorization Type [employer] medicare   Authorization - Visit Number 10   Authorization - Number of Visits 19   PT Start Time 1120   PT Stop Time 1145   PT Time Calculation (min) 25 min   Activity Tolerance Patient tolerated treatment well   Behavior During Therapy [facility] for tasks assessed/performed      Past Medical History:  Diagnosis Date  . Hypertension   . Stroke [facility])     No past surgical history on file.  There were no vitals filed for this visit.                  Wound Therapy - 12/19/15 1154    Subjective Pt states she has a "blister" on the side of her foot and she called the MD and has been on antibiotics for 3 days now.  Reports no pain or other issues.   Patient and Family Stated Goals wound to heal    Date of Onset 10/06/15   Prior Treatments antibiotic; self care with peroxide   Evaluation and Treatment Procedures --   Lt MTP wound .8x.5 depth at least 2.0 cm. halo callous 2.0   Wound Properties Date First Assessed: 10/27/15 Time First Assessed: 0950 Wound Type: Puncture;Diabetic ulcer Location: Foot Location Orientation: Left Wound Description (Comments): plantar aspect of 5th MTP   Dressing Type Tape dressing   Dressing Status Intact;None   Dressing Change Frequency PRN   Site / Wound Assessment Pale   % Wound base Red or Granulating 85%  after debridement was 10%   % Wound base Yellow 15%  after debridement  Was 90%   Peri-wound Assessment Maceration  callous halo significanty decreased.    Wound Length (cm) 0.2 cm was ,8 cnm   Wound Width  (cm) 0.2 cm was .5 cm    Wound Depth (cm) 1 cm was 2 cm    Margins Epibole (rolled edges)   Drainage Amount Minimal   Drainage Description Serous   Treatment Cleansed;Debridement (Selective)   Selective Debridement - Location devitalized tissue and callous around wound    Selective Debridement - Tools Used Forceps;Scissors   Selective Debridement - Tissue Removed epiboled edges, slough and callous    Wound Therapy - Clinical Statement No blister discovered, however redness around perimeter of wound on plantar surface and up around side to pinky toe.  Recommended pateint call MD and see him if not better by tomorrow.  Redness should be subsiding.  Wound itself again grown over by callous and when removed revealed same size opening as previous appointment.  Continued with medihoney gel and medipore dressing secured by #3 netting. ast 1 cm depth.  Cleansed and repacked with medihoney gel gauze.  Pt instructed to attempt to leave packing in and only change the outside bandaging.  Pt verbalized understanding.  Darco boot ordered by evaluating therapist still has not arrived to clinic at this point.  Pt continues to wear her crocs and reports overall comfort with these.    Wound Therapy - Functional Problem  List unable to wear majority of her shoes    Factors Delaying/Impairing Wound Healing Altered sensation;Diabetes Mellitus;Infection - systemic/local   Hydrotherapy Plan Debridement;Dressing change;Patient/family education;Pulsatile lavage with suction   Wound Therapy - Frequency --  change to one time a week for the next four weeks    Wound Therapy - Current Recommendations PT   Wound Therapy - Follow Up Recommendations Other (comment)   Wound Plan Encourage pt to see MD regarding redness and continued need to see a vascular surgeon.  Continue debridement and dressing changes as appropriate.   Dressing  medihoney gauze packed into wound, 2X2, medipore tape and #3 netting                    PT Short Term Goals - 12/01/15 1142      PT SHORT TERM GOAL #1   Title Pt to be able to verbalize proper self care for wound    Time 1   Period Weeks   Status Achieved     PT SHORT TERM GOAL #2   Title Wound depth to decrease by 1cm    Time 3   Period Weeks   Status On-going     PT SHORT TERM GOAL #3   Title Pt to verbalize the importance of keeping blood sugars at a normal level and increase uptake of protien in the wound healing process.    Time 3   Period Weeks   Status Achieved           PT Long Term Goals - 12/01/15 1143      PT LONG TERM GOAL #1   Title Pt to no longer have a callous on the plantar aspect of her Lt 5th MTP jt to decrease pressure off of wound   Time 4   Period Weeks   Status Partially Met     PT LONG TERM GOAL #2   Title Pt wound depth to be no greater than .5 cm to allow pt to care for wound at home    Time 6   Period Weeks   Status On-going     PT LONG TERM GOAL #3   Title Pt to be able to don supportive shoes without feeling increased pressure on her left foot.   Status On-going     [phone] CJ [phone] CI        Patient will benefit from skilled therapeutic intervention in order to improve the following deficits and impairments:     Visit Diagnosis: Type 2 diabetes mellitus with diabetic peripheral angiopathy and gangrene, without long-term current use of insulin (HCC)  Diabetic gangrene ([place])  Open wound of left foot, subsequent encounter  Diabetes mellitus due to underlying condition with diabetic neuropathy, without long-term current use of insulin [facility])     Problem List Patient Active Problem List   Diagnosis Date Noted  . Upper airway cough syndrome 06/26/2013  . DIABETES MELLITUS, TYPE II 01/31/2010  . HYPERLIPIDEMIA 01/31/2010  . OBESITY 01/31/2010  . Essential hypertension 01/31/2010    Teena Irani, PTA/CLT [place], PT CLT [phone] 12/19/2015, 12:03 PM  [place] [address] [place], [place], [address] Phone: [phone]   Fax:  [phone]  Name: Shelly Bauer MRN: [id number] Date of Birth: 07/15/1945

## 2015-12-20 ENCOUNTER — Ambulatory Visit (HOSPITAL_COMMUNITY): Payer: Medicare HMO | Admitting: Physical Therapy

## 2015-12-27 ENCOUNTER — Telehealth (HOSPITAL_COMMUNITY): Payer: Self-pay

## 2015-12-27 ENCOUNTER — Ambulatory Visit (HOSPITAL_COMMUNITY): Payer: Medicare HMO | Admitting: Physical Therapy

## 2015-12-27 NOTE — Telephone Encounter (Signed)
12/27/15 pt cx - she has strep throat and hopefully can come in on Thursday

## 2015-12-29 ENCOUNTER — Ambulatory Visit (HOSPITAL_COMMUNITY): Payer: Medicare HMO | Admitting: Physical Therapy

## 2015-12-29 ENCOUNTER — Telehealth (HOSPITAL_COMMUNITY): Payer: Self-pay | Admitting: Physical Therapy

## 2015-12-29 NOTE — Telephone Encounter (Signed)
Pt did not show for appointment.  Called and left message concerning NS and reminded of next appt on Tuesday at 9:45 Lurena Nidamy B Frazier, PTA/CLT [phone]

## 2016-01-02 ENCOUNTER — Telehealth (HOSPITAL_COMMUNITY): Payer: Self-pay

## 2016-01-02 NOTE — Telephone Encounter (Signed)
9/25 patient called to confirm appt time today but she was on the schedule actully for 9/26 at 9:45 and I gave her that information in the message

## 2016-01-03 ENCOUNTER — Ambulatory Visit (HOSPITAL_COMMUNITY): Payer: Medicare HMO | Admitting: Physical Therapy

## 2016-01-03 DIAGNOSIS — S91302D Unspecified open wound, left foot, subsequent encounter: Secondary | ICD-10-CM

## 2016-01-03 DIAGNOSIS — E1152 Type 2 diabetes mellitus with diabetic peripheral angiopathy with gangrene: Secondary | ICD-10-CM | POA: Diagnosis not present

## 2016-01-03 DIAGNOSIS — E0840 Diabetes mellitus due to underlying condition with diabetic neuropathy, unspecified: Secondary | ICD-10-CM

## 2016-01-03 NOTE — Therapy (Addendum)
[place], [place], [address] Phone: [phone]   Fax:  [phone]  Wound Care Therapy  Patient Details  Name: Shelly Bauer MRN: [id number] Date of Birth: 19-Feb-1946 No Data Recorded  Encounter Date: 01/03/2016      PT End of Session - 01/03/16 1159    Visit Number 10   Number of Visits 13   Date for PT Re-Evaluation 01/16/16   Authorization Type [employer] medicare, cert 5/[id number]   Authorization - Visit Number 10   Authorization - Number of Visits 19   PT Start Time 0950   PT Stop Time 1010   PT Time Calculation (min) 20 min   Activity Tolerance Patient tolerated treatment well   Behavior During Therapy [facility] for tasks assessed/performed      Past Medical History:  Diagnosis Date  . Hypertension   . Stroke [facility])     No past surgical history on file.  There were no vitals filed for this visit.                  Wound Therapy - 01/03/16 1152    Subjective Pt states she's been taking care of her foot.  States she was worried about the blister on the side of her foot so got a 10 day course of antibiotics.  Doing well overall.   Patient and Family Stated Goals wound to heal    Date of Onset 10/06/15   Prior Treatments antibiotic; self care with peroxide   Evaluation and Treatment Procedures --   Lt MTP wound .8x.5 depth at least 2.0 cm. halo callous 2.0   Wound Properties Date First Assessed: 10/27/15 Time First Assessed: 0950 Wound Type: Puncture;Diabetic ulcer Location: Foot Location Orientation: Left Wound Description (Comments): plantar aspect of 5th MTP   Dressing Type Tape dressing   Dressing Status Intact;None   Dressing Change Frequency PRN   Site / Wound Assessment Pale   % Wound base Red or Granulating 100%  after debridement    % Wound base Yellow 0%  after debridement    Peri-wound Assessment Intact  callous   Margins Attached edges (approximated)   Drainage Amount None    Treatment Cleansed;Debridement (Selective)   Selective Debridement - Location perimeter of wound, plantar foot   Selective Debridement - Tools Used Forceps;Scissors   Selective Debridement - Tissue Removed callous and dry skin   Wound Therapy - Clinical Statement No open areas revealed after removing dry dead skin from lateral foot and callous from plantar surface of foot.  Unable to discover a wound at this point.  Able to remove 90% of callous from plantar aspect of foot.  Bandaged with gauze and medipore to protect area.  Suggested to keep one more appointment to ensure complete healing before we discharge.   Wound Therapy - Functional Problem List unable to wear majority of her shoes    Factors Delaying/Impairing Wound Healing Altered sensation;Diabetes Mellitus;Infection - systemic/local   Hydrotherapy Plan Debridement;Dressing change;Patient/family education;Pulsatile lavage with suction   Wound Therapy - Frequency --  change to one time a week for the next four weeks    Wound Therapy - Current Recommendations PT   Wound Therapy - Follow Up Recommendations Other (comment)   Wound Plan May be ready for discharge next session if wound remains healed.   Dressing  gauze and medipore tape.  No topical dressing used this session.  PT Short Term Goals - 12/01/15 1142      PT SHORT TERM GOAL #1   Title Pt to be able to verbalize proper self care for wound    Time 1   Period Weeks   Status Achieved     PT SHORT TERM GOAL #2   Title Wound depth to decrease by 1cm    Time 3   Period Weeks   Status On-going     PT SHORT TERM GOAL #3   Title Pt to verbalize the importance of keeping blood sugars at a normal level and increase uptake of protien in the wound healing process.    Time 3   Period Weeks   Status Achieved           PT Long Term Goals - 12/01/15 1143      PT LONG TERM GOAL #1   Title Pt to no longer have a callous on the plantar aspect of her  Lt 5th MTP jt to decrease pressure off of wound   Time 4   Period Weeks   Status Partially Met     PT LONG TERM GOAL #2   Title Pt wound depth to be no greater than .5 cm to allow pt to care for wound at home    Time 6   Period Weeks   Status On-going     PT LONG TERM GOAL #3   Title Pt to be able to don supportive shoes without feeling increased pressure on her left foot.   Status On-going       [id number] CI G8991 CI      Patient will benefit from skilled therapeutic intervention in order to improve the following deficits and impairments:     Visit Diagnosis: Type 2 diabetes mellitus with diabetic peripheral angiopathy and gangrene, without long-term current use of insulin (HCC)  Diabetic gangrene ([place])  Open wound of left foot, subsequent encounter  Diabetes mellitus due to underlying condition with diabetic neuropathy, without long-term current use of insulin [facility])     Problem List Patient Active Problem List   Diagnosis Date Noted  . Upper airway cough syndrome 06/26/2013  . DIABETES MELLITUS, TYPE II 01/31/2010  . HYPERLIPIDEMIA 01/31/2010  . OBESITY 01/31/2010  . Essential hypertension 01/31/2010    Teena Irani, PTA/CLT [place], PT CLT [phone] 01/03/2016, 12:00 PM  [place] [address] [place], [place], [address] Phone: [phone]   Fax:  [phone]  Name: Shelly Bauer MRN: [id number] Date of Birth: 02/15/1946    PHYSICAL THERAPY DISCHARGE SUMMARY  Visits from Start of Care: 10  Current functional level related to goals / functional outcomes: Wound no longer needs skilled care   Remaining deficits: n/a   Education / Equipment: Self care  Plan: Patient agrees to discharge.  Patient goals were not met. Patient is being discharged due to the patient's request.  ?????       Rayetta Humphrey, [place] CLT [phone]

## 2016-01-09 ENCOUNTER — Ambulatory Visit (HOSPITAL_COMMUNITY): Payer: Medicare HMO | Admitting: Physical Therapy

## 2016-01-10 ENCOUNTER — Telehealth (HOSPITAL_COMMUNITY): Payer: Self-pay

## 2016-01-10 ENCOUNTER — Ambulatory Visit (HOSPITAL_COMMUNITY): Payer: Medicare HMO | Admitting: Physical Therapy

## 2016-01-10 NOTE — Telephone Encounter (Signed)
10/3 left a messge that she had to pick up a grandchild that got hurt at school and take him to ER -- she wants us to call her back and try and reschedule for 10/5

## 2016-01-12 ENCOUNTER — Ambulatory Visit (HOSPITAL_COMMUNITY): Payer: Medicare HMO | Attending: Family Medicine | Admitting: Physical Therapy

## 2016-01-12 ENCOUNTER — Telehealth (HOSPITAL_COMMUNITY): Payer: Self-pay | Admitting: Physical Therapy

## 2016-01-12 NOTE — Telephone Encounter (Signed)
Pt did not show for appt.  Secretary made appt this morning and called and left message on voicemail.  Unsure if patient was home to check messages. Lurena NidaAmy B Frazier, PTA/CLT [phone]

## 2016-01-17 ENCOUNTER — Ambulatory Visit (HOSPITAL_COMMUNITY): Payer: Medicare HMO | Admitting: Physical Therapy

## 2016-01-17 ENCOUNTER — Telehealth (HOSPITAL_COMMUNITY): Payer: Self-pay | Admitting: Physical Therapy

## 2016-01-17 NOTE — Telephone Encounter (Signed)
Pt did not show for appt.  Called and left message informing of second NS and reminder of next appt on Tuesday at 9:45.  All further appts removed due to NS policy. Lurena NidaAmy B Frazier, PTA/CLT [phone]

## 2016-01-23 ENCOUNTER — Telehealth (HOSPITAL_COMMUNITY): Payer: Self-pay

## 2016-01-23 NOTE — Telephone Encounter (Signed)
10/16 she left a message to say that she hadn't been to her appts because her husband is a [facility] [place] in [facility] condition

## 2016-01-24 ENCOUNTER — Telehealth (HOSPITAL_COMMUNITY): Payer: Self-pay | Admitting: Physical Therapy

## 2016-01-24 ENCOUNTER — Ambulatory Visit (HOSPITAL_COMMUNITY): Payer: Medicare HMO | Admitting: Physical Therapy

## 2016-01-24 NOTE — Telephone Encounter (Signed)
Pt did not show for appointment.  Left message regarding NS and this is her last scheduled appointment.  Asked to call clinic and make another appointment if needed. Lurena NidaAmy B Alvine Mostafa, PTA/CLT [phone]

## 2016-01-31 ENCOUNTER — Ambulatory Visit (HOSPITAL_COMMUNITY): Payer: Medicare HMO | Admitting: Physical Therapy

## 2016-02-07 ENCOUNTER — Ambulatory Visit (HOSPITAL_COMMUNITY): Payer: Medicare HMO | Admitting: Physical Therapy

## 2016-02-16 ENCOUNTER — Encounter (INDEPENDENT_AMBULATORY_CARE_PROVIDER_SITE_OTHER): Payer: Self-pay

## 2016-02-16 ENCOUNTER — Encounter (INDEPENDENT_AMBULATORY_CARE_PROVIDER_SITE_OTHER): Payer: Self-pay | Admitting: *Deleted

## 2016-04-16 ENCOUNTER — Other Ambulatory Visit: Payer: Self-pay | Admitting: Internal Medicine

## 2016-04-16 ENCOUNTER — Telehealth: Payer: Self-pay | Admitting: Internal Medicine

## 2016-04-16 NOTE — Telephone Encounter (Signed)
rec 3 m rx then needs to return to primary care for f/u and refills    Patient Instructions by Nyoka CowdenMichael B Wert, MD at 10/31/2015 9:00 AM   Dr. Sherene SiresWert wanted her to receive her refills from her PCP after this visit. Spoke with pt, she states her PCP feels we should refill this medication. Can we refill?

## 2016-04-16 NOTE — Telephone Encounter (Signed)
Ok x one month then ov with NP to check BMET and figure out what 's up with refill per PC (suspect a communication problem) as pulmonary f/u will be prn unless there are ongoing pulmonary issues

## 2016-04-16 NOTE — Telephone Encounter (Signed)
Tried to call pt. But no vm was set up yet.

## 2016-04-17 MED ORDER — VALSARTAN-HYDROCHLOROTHIAZIDE 160-25 MG PO TABS: 1.0000 | ORAL_TABLET | Freq: Every day | ORAL | 2 refills | Status: DC

## 2016-04-17 NOTE — Telephone Encounter (Signed)
Pt requesting diovan rx to be sent to [facility] [place].  This has been sent.  Nothing further needed.

## 2016-04-27 ENCOUNTER — Other Ambulatory Visit: Payer: Self-pay | Admitting: Obstetrics and Gynecology

## 2016-04-27 DIAGNOSIS — Z1231 Encounter for screening mammogram for malignant neoplasm of breast: Secondary | ICD-10-CM

## 2016-05-28 ENCOUNTER — Ambulatory Visit: Payer: Medicare HMO

## 2016-06-26 ENCOUNTER — Ambulatory Visit
Admission: RE | Admit: 2016-06-26 | Discharge: 2016-06-26 | Disposition: A | Discharge status: Home / Self Care | Payer: Medicare HMO | Source: Ambulatory Visit | Attending: Obstetrics and Gynecology | Admitting: Obstetrics and Gynecology

## 2016-06-26 DIAGNOSIS — Z1231 Encounter for screening mammogram for malignant neoplasm of breast: Secondary | ICD-10-CM

## 2016-07-05 ENCOUNTER — Telehealth: Payer: Self-pay | Admitting: Internal Medicine

## 2016-07-05 NOTE — Telephone Encounter (Signed)
Spoke with Elnita Maxwellheryl with [facility]  She is calling as FYI to let us know that pt has not been getting her Valsartan filled  She missed June, October, Dec, and March  I advised when we saw pt last, the plan was for to followup with PCP on her BP  Will forward to MW to make him aware

## 2016-07-05 NOTE — Telephone Encounter (Signed)
Valsartan was not filled in June, October, December and March.  Metformin was not filled April through May, August and December.

## 2016-07-11 ENCOUNTER — Other Ambulatory Visit: Payer: Self-pay | Admitting: Internal Medicine

## 2016-07-11 MED ORDER — VALSARTAN-HYDROCHLOROTHIAZIDE 160-25 MG PO TABS: 1.0000 | ORAL_TABLET | Freq: Every day | ORAL | 0 refills | Status: DC

## 2016-07-11 NOTE — Telephone Encounter (Signed)
Called Allen at [facility], aware to pass this message along to her PCP Dr Assunta Found. Nothing further needed.

## 2016-07-11 NOTE — Telephone Encounter (Signed)
f/u here was prn so pass along this note to her PCP but nothing more we can do here.

## 2016-09-05 DIAGNOSIS — N9411 Superficial (introital) dyspareunia: Secondary | ICD-10-CM | POA: Diagnosis not present

## 2016-09-13 DIAGNOSIS — Z6827 Body mass index (BMI) 27.0-27.9, adult: Secondary | ICD-10-CM | POA: Diagnosis not present

## 2016-09-13 DIAGNOSIS — E1165 Type 2 diabetes mellitus with hyperglycemia: Secondary | ICD-10-CM | POA: Diagnosis not present

## 2016-09-13 DIAGNOSIS — I10 Essential (primary) hypertension: Secondary | ICD-10-CM | POA: Diagnosis not present

## 2016-09-13 DIAGNOSIS — L84 Corns and callosities: Secondary | ICD-10-CM | POA: Diagnosis not present

## 2016-09-13 DIAGNOSIS — Z1389 Encounter for screening for other disorder: Secondary | ICD-10-CM | POA: Diagnosis not present

## 2016-11-05 ENCOUNTER — Telehealth: Payer: Self-pay | Admitting: Internal Medicine

## 2016-11-05 NOTE — Telephone Encounter (Signed)
We have not seen the pt in over 1 year. Her PCP will need to give her an alternative. Attempted to contact pt. No answer, no option to leave a message due to her voicemail not being set up. Will try back.

## 2016-11-06 NOTE — Telephone Encounter (Signed)
Pt calling to check on the status of valsartan, is she to still take it or not and is there an alternative, please advise, she can be reached @ [phone].Caren GriffinsStanley A Dalton

## 2016-11-06 NOTE — Telephone Encounter (Signed)
atc pt X2, no answer, vm not set up.  Wcb.

## 2016-11-07 NOTE — Telephone Encounter (Signed)
Called patient back at the [phone] number, she did not answer and VM had not been setup yet.

## 2016-11-07 NOTE — Telephone Encounter (Signed)
Left message for patient to call back  

## 2016-11-07 NOTE — Telephone Encounter (Signed)
[phone] pt calling back

## 2016-11-08 NOTE — Telephone Encounter (Signed)
Patient returning call - she can be reached at [phone] -pr

## 2016-11-08 NOTE — Telephone Encounter (Signed)
Attempted to contact the pt. No answer and I could not leave a message due to her voicemail not being set up. Will try back.

## 2016-11-08 NOTE — Telephone Encounter (Signed)
lmtcb for pt.  

## 2016-11-09 NOTE — Telephone Encounter (Signed)
Attempted to call the pt and the VM has not been set up.  Will try again later.

## 2016-11-12 NOTE — Telephone Encounter (Signed)
ATC- no answer and no option to leave voicemail.  Will call back.

## 2016-11-13 NOTE — Telephone Encounter (Signed)
ATC. Unable to leave VM. Will sign off on message per triage protocol due to several unsuccessful attempts to reach pt.

## 2016-11-19 ENCOUNTER — Telehealth: Payer: Self-pay | Admitting: Internal Medicine

## 2016-11-19 NOTE — Telephone Encounter (Signed)
atc pt, no answer, no vm set up. See 7/30 phone note which was closed after multiple unsuccessful attempts to contact pt- pt needs to contact PCP for alternative med as she has not been seen since 10/2015.

## 2016-11-20 NOTE — Telephone Encounter (Signed)
Left message for patient to call back. She needs to contact her PCP for another medication.

## 2016-11-21 NOTE — Telephone Encounter (Signed)
Patient is returning phone call. Patient request for nurse to call her back at 5 pm.

## 2016-11-22 NOTE — Telephone Encounter (Signed)
Pt aware of recommendation to contact her PCP who follows this medication.  Nothing further needed.

## 2017-01-30 DIAGNOSIS — Z6828 Body mass index (BMI) 28.0-28.9, adult: Secondary | ICD-10-CM | POA: Diagnosis not present

## 2017-01-30 DIAGNOSIS — E119 Type 2 diabetes mellitus without complications: Secondary | ICD-10-CM | POA: Diagnosis not present

## 2017-01-30 DIAGNOSIS — E663 Overweight: Secondary | ICD-10-CM | POA: Diagnosis not present

## 2017-01-30 DIAGNOSIS — I10 Essential (primary) hypertension: Secondary | ICD-10-CM | POA: Diagnosis not present

## 2017-01-30 DIAGNOSIS — J309 Allergic rhinitis, unspecified: Secondary | ICD-10-CM | POA: Diagnosis not present

## 2017-01-30 DIAGNOSIS — Z0001 Encounter for general adult medical examination with abnormal findings: Secondary | ICD-10-CM | POA: Diagnosis not present

## 2017-01-30 DIAGNOSIS — D649 Anemia, unspecified: Secondary | ICD-10-CM | POA: Diagnosis not present

## 2017-01-30 DIAGNOSIS — E782 Mixed hyperlipidemia: Secondary | ICD-10-CM | POA: Diagnosis not present

## 2017-01-30 DIAGNOSIS — R768 Other specified abnormal immunological findings in serum: Secondary | ICD-10-CM | POA: Diagnosis not present

## 2017-01-30 DIAGNOSIS — Z1389 Encounter for screening for other disorder: Secondary | ICD-10-CM | POA: Diagnosis not present

## 2017-01-30 DIAGNOSIS — E1165 Type 2 diabetes mellitus with hyperglycemia: Secondary | ICD-10-CM | POA: Diagnosis not present

## 2017-01-30 DIAGNOSIS — R760 Raised antibody titer: Secondary | ICD-10-CM | POA: Diagnosis not present

## 2017-02-14 DIAGNOSIS — E538 Deficiency of other specified B group vitamins: Secondary | ICD-10-CM | POA: Diagnosis not present

## 2017-02-26 DIAGNOSIS — Z01419 Encounter for gynecological examination (general) (routine) without abnormal findings: Secondary | ICD-10-CM | POA: Diagnosis not present

## 2017-02-26 DIAGNOSIS — Z23 Encounter for immunization: Secondary | ICD-10-CM | POA: Diagnosis not present

## 2017-03-27 DIAGNOSIS — E538 Deficiency of other specified B group vitamins: Secondary | ICD-10-CM | POA: Diagnosis not present

## 2017-03-27 DIAGNOSIS — R768 Other specified abnormal immunological findings in serum: Secondary | ICD-10-CM | POA: Diagnosis not present

## 2017-04-04 DIAGNOSIS — J329 Chronic sinusitis, unspecified: Secondary | ICD-10-CM | POA: Diagnosis not present

## 2017-04-04 DIAGNOSIS — M1991 Primary osteoarthritis, unspecified site: Secondary | ICD-10-CM | POA: Diagnosis not present

## 2017-04-04 DIAGNOSIS — E663 Overweight: Secondary | ICD-10-CM | POA: Diagnosis not present

## 2017-04-04 DIAGNOSIS — Z6828 Body mass index (BMI) 28.0-28.9, adult: Secondary | ICD-10-CM | POA: Diagnosis not present

## 2017-04-04 DIAGNOSIS — Z1211 Encounter for screening for malignant neoplasm of colon: Secondary | ICD-10-CM | POA: Diagnosis not present

## 2017-04-04 DIAGNOSIS — I10 Essential (primary) hypertension: Secondary | ICD-10-CM | POA: Diagnosis not present

## 2017-04-27 DIAGNOSIS — R21 Rash and other nonspecific skin eruption: Secondary | ICD-10-CM | POA: Diagnosis not present

## 2017-04-27 DIAGNOSIS — I10 Essential (primary) hypertension: Secondary | ICD-10-CM | POA: Diagnosis not present

## 2017-05-23 DIAGNOSIS — E538 Deficiency of other specified B group vitamins: Secondary | ICD-10-CM | POA: Diagnosis not present

## 2017-05-23 DIAGNOSIS — R799 Abnormal finding of blood chemistry, unspecified: Secondary | ICD-10-CM | POA: Diagnosis not present

## 2017-06-10 DIAGNOSIS — Z6828 Body mass index (BMI) 28.0-28.9, adult: Secondary | ICD-10-CM | POA: Diagnosis not present

## 2017-06-10 DIAGNOSIS — E119 Type 2 diabetes mellitus without complications: Secondary | ICD-10-CM | POA: Diagnosis not present

## 2017-06-10 DIAGNOSIS — E11649 Type 2 diabetes mellitus with hypoglycemia without coma: Secondary | ICD-10-CM | POA: Diagnosis not present

## 2017-06-10 DIAGNOSIS — E663 Overweight: Secondary | ICD-10-CM | POA: Diagnosis not present

## 2017-06-10 DIAGNOSIS — R05 Cough: Secondary | ICD-10-CM | POA: Diagnosis not present

## 2017-06-10 DIAGNOSIS — J042 Acute laryngotracheitis: Secondary | ICD-10-CM | POA: Diagnosis not present

## 2017-06-10 DIAGNOSIS — M1991 Primary osteoarthritis, unspecified site: Secondary | ICD-10-CM | POA: Diagnosis not present

## 2017-06-18 ENCOUNTER — Other Ambulatory Visit: Payer: Self-pay | Admitting: Obstetrics and Gynecology

## 2017-06-18 DIAGNOSIS — Z139 Encounter for screening, unspecified: Secondary | ICD-10-CM

## 2017-06-25 DIAGNOSIS — D510 Vitamin B12 deficiency anemia due to intrinsic factor deficiency: Secondary | ICD-10-CM | POA: Diagnosis not present

## 2017-07-13 DIAGNOSIS — J209 Acute bronchitis, unspecified: Secondary | ICD-10-CM | POA: Diagnosis not present

## 2017-07-15 DIAGNOSIS — E1165 Type 2 diabetes mellitus with hyperglycemia: Secondary | ICD-10-CM | POA: Diagnosis not present

## 2017-07-15 DIAGNOSIS — Z6827 Body mass index (BMI) 27.0-27.9, adult: Secondary | ICD-10-CM | POA: Diagnosis not present

## 2017-07-15 DIAGNOSIS — I10 Essential (primary) hypertension: Secondary | ICD-10-CM | POA: Diagnosis not present

## 2017-07-15 DIAGNOSIS — E119 Type 2 diabetes mellitus without complications: Secondary | ICD-10-CM | POA: Diagnosis not present

## 2017-07-15 DIAGNOSIS — J042 Acute laryngotracheitis: Secondary | ICD-10-CM | POA: Diagnosis not present

## 2017-07-15 DIAGNOSIS — Z1389 Encounter for screening for other disorder: Secondary | ICD-10-CM | POA: Diagnosis not present

## 2017-07-15 DIAGNOSIS — J9801 Acute bronchospasm: Secondary | ICD-10-CM | POA: Diagnosis not present

## 2017-07-15 DIAGNOSIS — J329 Chronic sinusitis, unspecified: Secondary | ICD-10-CM | POA: Diagnosis not present

## 2017-07-15 DIAGNOSIS — R498 Other voice and resonance disorders: Secondary | ICD-10-CM | POA: Diagnosis not present

## 2017-07-16 ENCOUNTER — Ambulatory Visit: Payer: Medicare HMO

## 2017-08-12 DIAGNOSIS — E538 Deficiency of other specified B group vitamins: Secondary | ICD-10-CM | POA: Diagnosis not present

## 2017-08-29 ENCOUNTER — Ambulatory Visit: Payer: Medicare HMO

## 2017-09-19 ENCOUNTER — Ambulatory Visit: Payer: Medicare HMO

## 2017-10-29 ENCOUNTER — Ambulatory Visit: Payer: Medicare HMO

## 2017-10-30 ENCOUNTER — Ambulatory Visit
Admission: RE | Admit: 2017-10-30 | Discharge: 2017-10-30 | Disposition: A | Discharge status: Home / Self Care | Payer: Medicare HMO | Source: Ambulatory Visit | Attending: Obstetrics and Gynecology | Admitting: Obstetrics and Gynecology

## 2017-10-30 DIAGNOSIS — Z1231 Encounter for screening mammogram for malignant neoplasm of breast: Secondary | ICD-10-CM | POA: Diagnosis not present

## 2017-10-30 DIAGNOSIS — Z139 Encounter for screening, unspecified: Secondary | ICD-10-CM

## 2017-11-06 DIAGNOSIS — D519 Vitamin B12 deficiency anemia, unspecified: Secondary | ICD-10-CM | POA: Diagnosis not present

## 2018-01-29 DIAGNOSIS — E538 Deficiency of other specified B group vitamins: Secondary | ICD-10-CM | POA: Diagnosis not present

## 2018-02-28 DIAGNOSIS — E538 Deficiency of other specified B group vitamins: Secondary | ICD-10-CM | POA: Diagnosis not present

## 2018-03-26 DIAGNOSIS — E538 Deficiency of other specified B group vitamins: Secondary | ICD-10-CM | POA: Diagnosis not present

## 2018-04-25 DIAGNOSIS — E538 Deficiency of other specified B group vitamins: Secondary | ICD-10-CM | POA: Diagnosis not present

## 2018-06-05 DIAGNOSIS — E538 Deficiency of other specified B group vitamins: Secondary | ICD-10-CM | POA: Diagnosis not present

## 2018-06-12 ENCOUNTER — Ambulatory Visit: Payer: Medicare HMO | Admitting: Internal Medicine

## 2018-06-12 ENCOUNTER — Other Ambulatory Visit: Payer: Self-pay | Admitting: Obstetrics and Gynecology

## 2018-06-12 DIAGNOSIS — M810 Age-related osteoporosis without current pathological fracture: Secondary | ICD-10-CM

## 2018-06-12 DIAGNOSIS — Z01419 Encounter for gynecological examination (general) (routine) without abnormal findings: Secondary | ICD-10-CM | POA: Diagnosis not present

## 2018-06-12 DIAGNOSIS — R300 Dysuria: Secondary | ICD-10-CM | POA: Diagnosis not present

## 2018-06-19 DIAGNOSIS — I10 Essential (primary) hypertension: Secondary | ICD-10-CM | POA: Diagnosis not present

## 2018-06-19 DIAGNOSIS — Z1389 Encounter for screening for other disorder: Secondary | ICD-10-CM | POA: Diagnosis not present

## 2018-06-19 DIAGNOSIS — R002 Palpitations: Secondary | ICD-10-CM | POA: Diagnosis not present

## 2018-06-19 DIAGNOSIS — Z6828 Body mass index (BMI) 28.0-28.9, adult: Secondary | ICD-10-CM | POA: Diagnosis not present

## 2018-06-19 DIAGNOSIS — E1165 Type 2 diabetes mellitus with hyperglycemia: Secondary | ICD-10-CM | POA: Diagnosis not present

## 2018-06-19 DIAGNOSIS — I4891 Unspecified atrial fibrillation: Secondary | ICD-10-CM | POA: Diagnosis not present

## 2018-06-19 DIAGNOSIS — E782 Mixed hyperlipidemia: Secondary | ICD-10-CM | POA: Diagnosis not present

## 2018-06-19 DIAGNOSIS — J329 Chronic sinusitis, unspecified: Secondary | ICD-10-CM | POA: Diagnosis not present

## 2018-06-19 DIAGNOSIS — Z23 Encounter for immunization: Secondary | ICD-10-CM | POA: Diagnosis not present

## 2018-06-19 DIAGNOSIS — R05 Cough: Secondary | ICD-10-CM | POA: Diagnosis not present

## 2018-06-19 DIAGNOSIS — Z0001 Encounter for general adult medical examination with abnormal findings: Secondary | ICD-10-CM | POA: Diagnosis not present

## 2018-07-15 ENCOUNTER — Encounter: Payer: Self-pay | Admitting: Cardiology

## 2018-07-31 ENCOUNTER — Inpatient Hospital Stay (HOSPITAL_COMMUNITY)
Admission: EM | Admit: 2018-07-31 | Discharge: 2018-08-08 | DRG: 871 | Disposition: E | Payer: Medicare HMO | Attending: Internal Medicine | Admitting: Internal Medicine

## 2018-07-31 ENCOUNTER — Emergency Department (HOSPITAL_COMMUNITY): Payer: Medicare HMO

## 2018-07-31 ENCOUNTER — Encounter (HOSPITAL_COMMUNITY): Payer: Self-pay

## 2018-07-31 ENCOUNTER — Other Ambulatory Visit: Payer: Self-pay

## 2018-07-31 ENCOUNTER — Telehealth: Payer: Self-pay | Admitting: Cardiovascular Disease

## 2018-07-31 DIAGNOSIS — Z91120 Patient's intentional underdosing of medication regimen due to financial hardship: Secondary | ICD-10-CM

## 2018-07-31 DIAGNOSIS — I482 Chronic atrial fibrillation, unspecified: Secondary | ICD-10-CM | POA: Diagnosis not present

## 2018-07-31 DIAGNOSIS — J9601 Acute respiratory failure with hypoxia: Secondary | ICD-10-CM | POA: Diagnosis not present

## 2018-07-31 DIAGNOSIS — L089 Local infection of the skin and subcutaneous tissue, unspecified: Secondary | ICD-10-CM | POA: Diagnosis not present

## 2018-07-31 DIAGNOSIS — I509 Heart failure, unspecified: Secondary | ICD-10-CM | POA: Diagnosis present

## 2018-07-31 DIAGNOSIS — T45516A Underdosing of anticoagulants, initial encounter: Secondary | ICD-10-CM | POA: Diagnosis present

## 2018-07-31 DIAGNOSIS — Z7982 Long term (current) use of aspirin: Secondary | ICD-10-CM

## 2018-07-31 DIAGNOSIS — R6520 Severe sepsis without septic shock: Secondary | ICD-10-CM | POA: Diagnosis present

## 2018-07-31 DIAGNOSIS — Z8673 Personal history of transient ischemic attack (TIA), and cerebral infarction without residual deficits: Secondary | ICD-10-CM

## 2018-07-31 DIAGNOSIS — J181 Lobar pneumonia, unspecified organism: Secondary | ICD-10-CM

## 2018-07-31 DIAGNOSIS — E876 Hypokalemia: Secondary | ICD-10-CM | POA: Diagnosis not present

## 2018-07-31 DIAGNOSIS — E1151 Type 2 diabetes mellitus with diabetic peripheral angiopathy without gangrene: Secondary | ICD-10-CM | POA: Diagnosis present

## 2018-07-31 DIAGNOSIS — I469 Cardiac arrest, cause unspecified: Secondary | ICD-10-CM | POA: Diagnosis not present

## 2018-07-31 DIAGNOSIS — I4891 Unspecified atrial fibrillation: Secondary | ICD-10-CM | POA: Diagnosis not present

## 2018-07-31 DIAGNOSIS — I110 Hypertensive heart disease with heart failure: Secondary | ICD-10-CM | POA: Diagnosis present

## 2018-07-31 DIAGNOSIS — Z7984 Long term (current) use of oral hypoglycemic drugs: Secondary | ICD-10-CM

## 2018-07-31 DIAGNOSIS — J189 Pneumonia, unspecified organism: Secondary | ICD-10-CM | POA: Diagnosis present

## 2018-07-31 DIAGNOSIS — R0602 Shortness of breath: Secondary | ICD-10-CM | POA: Diagnosis not present

## 2018-07-31 DIAGNOSIS — R609 Edema, unspecified: Secondary | ICD-10-CM

## 2018-07-31 DIAGNOSIS — N179 Acute kidney failure, unspecified: Secondary | ICD-10-CM | POA: Diagnosis present

## 2018-07-31 DIAGNOSIS — I248 Other forms of acute ischemic heart disease: Secondary | ICD-10-CM | POA: Diagnosis not present

## 2018-07-31 DIAGNOSIS — R6510 Systemic inflammatory response syndrome (SIRS) of non-infectious origin without acute organ dysfunction: Secondary | ICD-10-CM | POA: Diagnosis not present

## 2018-07-31 DIAGNOSIS — Z515 Encounter for palliative care: Secondary | ICD-10-CM | POA: Diagnosis not present

## 2018-07-31 DIAGNOSIS — J90 Pleural effusion, not elsewhere classified: Secondary | ICD-10-CM | POA: Diagnosis not present

## 2018-07-31 DIAGNOSIS — Z7901 Long term (current) use of anticoagulants: Secondary | ICD-10-CM | POA: Diagnosis not present

## 2018-07-31 DIAGNOSIS — Z66 Do not resuscitate: Secondary | ICD-10-CM | POA: Diagnosis not present

## 2018-07-31 DIAGNOSIS — E872 Acidosis, unspecified: Secondary | ICD-10-CM | POA: Diagnosis present

## 2018-07-31 DIAGNOSIS — R0989 Other specified symptoms and signs involving the circulatory and respiratory systems: Secondary | ICD-10-CM | POA: Diagnosis not present

## 2018-07-31 DIAGNOSIS — E785 Hyperlipidemia, unspecified: Secondary | ICD-10-CM | POA: Diagnosis present

## 2018-07-31 DIAGNOSIS — L03115 Cellulitis of right lower limb: Secondary | ICD-10-CM | POA: Diagnosis present

## 2018-07-31 DIAGNOSIS — Z888 Allergy status to other drugs, medicaments and biological substances status: Secondary | ICD-10-CM

## 2018-07-31 DIAGNOSIS — J9600 Acute respiratory failure, unspecified whether with hypoxia or hypercapnia: Secondary | ICD-10-CM | POA: Diagnosis not present

## 2018-07-31 DIAGNOSIS — D689 Coagulation defect, unspecified: Secondary | ICD-10-CM | POA: Diagnosis not present

## 2018-07-31 DIAGNOSIS — Z982 Presence of cerebrospinal fluid drainage device: Secondary | ICD-10-CM

## 2018-07-31 DIAGNOSIS — Z885 Allergy status to narcotic agent status: Secondary | ICD-10-CM

## 2018-07-31 DIAGNOSIS — I739 Peripheral vascular disease, unspecified: Secondary | ICD-10-CM | POA: Diagnosis not present

## 2018-07-31 DIAGNOSIS — M779 Enthesopathy, unspecified: Secondary | ICD-10-CM | POA: Diagnosis not present

## 2018-07-31 DIAGNOSIS — R410 Disorientation, unspecified: Secondary | ICD-10-CM | POA: Diagnosis not present

## 2018-07-31 DIAGNOSIS — R0600 Dyspnea, unspecified: Secondary | ICD-10-CM

## 2018-07-31 DIAGNOSIS — A419 Sepsis, unspecified organism: Secondary | ICD-10-CM | POA: Diagnosis not present

## 2018-07-31 DIAGNOSIS — Z880 Allergy status to penicillin: Secondary | ICD-10-CM

## 2018-07-31 LAB — COMPREHENSIVE METABOLIC PANEL
ALT: 18 U/L (ref 0–44)
AST: 37 U/L (ref 15–41)
Albumin: 3.6 g/dL (ref 3.5–5.0)
Alkaline Phosphatase: 86 U/L (ref 38–126)
Anion gap: >20 — ABNORMAL HIGH (ref 5–15)
BUN: 28 mg/dL — ABNORMAL HIGH (ref 8–23)
CO2: 19 mmol/L — ABNORMAL LOW (ref 22–32)
Calcium: 7.5 mg/dL — ABNORMAL LOW (ref 8.9–10.3)
Chloride: 96 mmol/L — ABNORMAL LOW (ref 98–111)
Creatinine, Ser: 1.34 mg/dL — ABNORMAL HIGH (ref 0.44–1.00)
GFR calc Af Amer: 45 mL/min — ABNORMAL LOW (ref >60)
GFR calc non Af Amer: 39 mL/min — ABNORMAL LOW (ref >60)
Glucose, Bld: 225 mg/dL — ABNORMAL HIGH (ref 70–99)
Potassium: 2.8 mmol/L — ABNORMAL LOW (ref 3.5–5.1)
Sodium: 138 mmol/L (ref 135–145)
Total Bilirubin: 2.2 mg/dL — ABNORMAL HIGH (ref 0.3–1.2)
Total Protein: 7.3 g/dL (ref 6.5–8.1)

## 2018-07-31 LAB — CBC WITH DIFFERENTIAL/PLATELET
Abs Immature Granulocytes: 0.07 10*3/uL (ref 0.00–0.07)
Basophils Absolute: 0.1 10*3/uL (ref 0.0–0.1)
Basophils Relative: 1 %
Eosinophils Absolute: 0.1 10*3/uL (ref 0.0–0.5)
Eosinophils Relative: 0 %
HCT: 37.2 % (ref 36.0–46.0)
Hemoglobin: 10.8 g/dL — ABNORMAL LOW (ref 12.0–15.0)
Immature Granulocytes: 0 %
Lymphocytes Relative: 12 %
Lymphs Abs: 2 10*3/uL (ref 0.7–4.0)
MCH: 19.9 pg — ABNORMAL LOW (ref 26.0–34.0)
MCHC: 29 g/dL — ABNORMAL LOW (ref 30.0–36.0)
MCV: 68.6 fL — ABNORMAL LOW (ref 80.0–100.0)
Monocytes Absolute: 0.7 10*3/uL (ref 0.1–1.0)
Monocytes Relative: 5 %
Neutro Abs: 13.1 10*3/uL — ABNORMAL HIGH (ref 1.7–7.7)
Neutrophils Relative %: 82 %
Platelets: 454 10*3/uL — ABNORMAL HIGH (ref 150–400)
RBC: 5.42 MIL/uL — ABNORMAL HIGH (ref 3.87–5.11)
RDW: 21.8 % — ABNORMAL HIGH (ref 11.5–15.5)
WBC: 16 10*3/uL — ABNORMAL HIGH (ref 4.0–10.5)
nRBC: 0 % (ref 0.0–0.2)

## 2018-07-31 LAB — LACTIC ACID, PLASMA
Lactic Acid, Venous: 7.7 mmol/L (ref 0.5–1.9)
Lactic Acid, Venous: 5 mmol/L (ref 0.5–1.9)

## 2018-07-31 LAB — GLUCOSE, CAPILLARY: Glucose-Capillary: 169 mg/dL — ABNORMAL HIGH (ref 70–99)

## 2018-07-31 LAB — MAGNESIUM: Magnesium: 1 mg/dL — ABNORMAL LOW (ref 1.7–2.4)

## 2018-07-31 LAB — APTT: aPTT: 28 seconds (ref 24–36)

## 2018-07-31 LAB — TSH: TSH: 3.012 u[IU]/mL (ref 0.350–4.500)

## 2018-07-31 LAB — TROPONIN I: Troponin I: 0.12 ng/mL (ref <0.03)

## 2018-07-31 LAB — C-REACTIVE PROTEIN: CRP: 1.8 mg/dL — ABNORMAL HIGH (ref <1.0)

## 2018-07-31 LAB — SEDIMENTATION RATE: Sed Rate: 3 mm/hr (ref 0–22)

## 2018-07-31 MED ORDER — POTASSIUM CHLORIDE CRYS ER 20 MEQ PO TBCR
40.0000 meq | EXTENDED_RELEASE_TABLET | Freq: Once | ORAL | Status: AC
  Administered 2018-07-31: 40 meq via ORAL
  Filled 2018-07-31: qty 2

## 2018-07-31 MED ORDER — DILTIAZEM HCL 100 MG IV SOLR
5.0000 mg/h | INTRAVENOUS | Status: DC
  Administered 2018-07-31: 14:00:00 5 mg/h via INTRAVENOUS
  Filled 2018-07-31 (×2): qty 100

## 2018-07-31 MED ORDER — LORAZEPAM 2 MG/ML IJ SOLN
1.0000 mg | Freq: Once | INTRAMUSCULAR | Status: AC
  Administered 2018-07-31: 1 mg via INTRAVENOUS
  Filled 2018-07-31 (×2): qty 1

## 2018-07-31 MED ORDER — MAGNESIUM SULFATE 2 GM/50ML IV SOLN
2.0000 g | Freq: Once | INTRAVENOUS | Status: AC
  Administered 2018-07-31: 17:00:00 2 g via INTRAVENOUS
  Filled 2018-07-31: qty 50

## 2018-07-31 MED ORDER — SODIUM CHLORIDE 0.9 % IV SOLN: 250.0000 mL | INTRAVENOUS | Status: DC | PRN

## 2018-07-31 MED ORDER — ACETAMINOPHEN 325 MG PO TABS: 650.0000 mg | ORAL_TABLET | Freq: Four times a day (QID) | ORAL | Status: DC | PRN

## 2018-07-31 MED ORDER — METRONIDAZOLE IN NACL 5-0.79 MG/ML-% IV SOLN
500.0000 mg | Freq: Three times a day (TID) | INTRAVENOUS | Status: DC
  Administered 2018-08-01 – 2018-08-03 (×7): 500 mg via INTRAVENOUS
  Filled 2018-07-31 (×7): qty 100

## 2018-07-31 MED ORDER — SODIUM CHLORIDE 0.9% FLUSH: 3.0000 mL | INTRAVENOUS | Status: DC | PRN

## 2018-07-31 MED ORDER — DILTIAZEM LOAD VIA INFUSION
10.0000 mg | Freq: Once | INTRAVENOUS | Status: AC
  Administered 2018-07-31: 14:00:00 10 mg via INTRAVENOUS
  Filled 2018-07-31: qty 10

## 2018-07-31 MED ORDER — POTASSIUM CHLORIDE 10 MEQ/100ML IV SOLN
10.0000 meq | Freq: Once | INTRAVENOUS | Status: AC
  Administered 2018-07-31: 17:00:00 10 meq via INTRAVENOUS
  Filled 2018-07-31: qty 100

## 2018-07-31 MED ORDER — SODIUM CHLORIDE 0.9 % IV SOLN
2.0000 g | INTRAVENOUS | Status: DC
  Administered 2018-07-31 – 2018-08-02 (×3): 2 g via INTRAVENOUS
  Filled 2018-07-31 (×3): qty 20

## 2018-07-31 MED ORDER — HYDROCODONE-ACETAMINOPHEN 5-325 MG PO TABS: 1.0000 | ORAL_TABLET | ORAL | Status: DC | PRN

## 2018-07-31 MED ORDER — HEPARIN (PORCINE) 25000 UT/250ML-% IV SOLN
900.0000 [IU]/h | INTRAVENOUS | Status: DC
  Administered 2018-07-31: 19:00:00 800 [IU]/h via INTRAVENOUS
  Filled 2018-07-31: qty 250

## 2018-07-31 MED ORDER — SODIUM CHLORIDE 0.9% FLUSH
3.0000 mL | Freq: Two times a day (BID) | INTRAVENOUS | Status: DC
  Administered 2018-08-01 – 2018-08-02 (×4): 3 mL via INTRAVENOUS

## 2018-07-31 MED ORDER — BISACODYL 5 MG PO TBEC: 5.0000 mg | DELAYED_RELEASE_TABLET | Freq: Every day | ORAL | Status: DC | PRN

## 2018-07-31 MED ORDER — CLINDAMYCIN PHOSPHATE 600 MG/50ML IV SOLN
600.0000 mg | Freq: Once | INTRAVENOUS | Status: AC
  Administered 2018-07-31: 600 mg via INTRAVENOUS
  Filled 2018-07-31: qty 50

## 2018-07-31 MED ORDER — ACETAMINOPHEN 650 MG RE SUPP: 650.0000 mg | Freq: Four times a day (QID) | RECTAL | Status: DC | PRN

## 2018-07-31 MED ORDER — ONDANSETRON HCL 4 MG/2ML IJ SOLN
4.0000 mg | Freq: Four times a day (QID) | INTRAMUSCULAR | Status: DC | PRN
  Administered 2018-08-01 – 2018-08-02 (×3): 4 mg via INTRAVENOUS
  Filled 2018-07-31 (×3): qty 2

## 2018-07-31 MED ORDER — SENNOSIDES-DOCUSATE SODIUM 8.6-50 MG PO TABS
1.0000 | ORAL_TABLET | Freq: Every evening | ORAL | Status: DC | PRN
  Filled 2018-07-31: qty 1

## 2018-07-31 MED ORDER — POTASSIUM CHLORIDE CRYS ER 20 MEQ PO TBCR
40.0000 meq | EXTENDED_RELEASE_TABLET | Freq: Once | ORAL | Status: DC
  Filled 2018-07-31: qty 2

## 2018-07-31 MED ORDER — ONDANSETRON HCL 4 MG PO TABS: 4.0000 mg | ORAL_TABLET | Freq: Four times a day (QID) | ORAL | Status: DC | PRN

## 2018-07-31 MED ORDER — HYDROCORTISONE 1 % EX CREA
1.0000 "application " | TOPICAL_CREAM | Freq: Three times a day (TID) | CUTANEOUS | Status: DC | PRN
  Administered 2018-07-31: 1 via TOPICAL
  Filled 2018-07-31: qty 28

## 2018-07-31 NOTE — ED Notes (Signed)
Date and time results received: 07/12/2018 2:47 PM  (use smartphrase ".now" to insert current time)  Test: Tropnoin Critical Value: 0.12  Name of Provider Notified: Tobi Bastos  Orders Received? Or Actions Taken?: Orders Received - See Orders for details

## 2018-07-31 NOTE — ED Notes (Signed)
Date and time results received: 08/11/18 1428 (use smartphrase ".now" to insert current time)  Test: lac acid Critical Value: 7.7  Name of Provider Notified: pickering  Orders Received? Or Actions Taken?: see chart

## 2018-07-31 NOTE — ED Notes (Signed)
Okay to give rocephin with known mild allergy to penicillin per Dr Arlean Hopping.

## 2018-07-31 NOTE — ED Provider Notes (Signed)
[facility] EMERGENCY DEPARTMENT Provider Note   CSN: [id number] Arrival date & time: Aug 17, 2018  1302    History   Chief Complaint No chief complaint on file. Palpitations Foot infection  HPI Shelly Bauer is a 73 y.o. female with a PMH of T2DM, HTN, and CVA presenting with palpitations and a right foot infection. Patient reports she evaluated by PCP and advised to come to the ER. Patient reports her right foot initially had a blister and the blister developed into a wound. Patient reports bilateral leg edema. Patient reports generalized weakness and states she is able to ambulate with assistance. Patient states she is typically able to ambulate on her own. Patient denies any pain. Patient denies numbness or paresthesias. Patient states she has not been taking her Xarelto over the past few days due to cost. Patient is unable to state when the palpitations began. Patient states palpitations are intermittent and worsen when she has anxiety. Patient denies a history of atrial fibrillation. Patient denies fever, cough, congestion, abdominal pain, nausea, or vomiting. Patient denies sick contacts or recent travel.      HPI  Past Medical History:  Diagnosis Date  . Hypertension   . Stroke [facility])     Patient Active Problem List   Diagnosis Date Noted  . Upper airway cough syndrome 06/26/2013  . DIABETES MELLITUS, TYPE II 01/31/2010  . HYPERLIPIDEMIA 01/31/2010  . OBESITY 01/31/2010  . Essential hypertension 01/31/2010    History reviewed. No pertinent surgical history.   OB History   No obstetric history on file.      Home Medications    Prior to Admission medications   Medication Sig Start Date End Date Taking? Authorizing Provider  XARELTO 20 MG TABS tablet Take 20 mg by mouth daily. 06/19/18  Yes [provider]  aspirin 325 MG tablet Take 325 mg by mouth daily.    [provider]  benzonatate (TESSALON) 100 MG capsule Take 200 mg by mouth 3 (three) times  daily as needed for cough.    [provider]  clindamycin (CLEOCIN) 300 MG capsule Take 1 capsule by mouth 2 (two) times daily. 10/28/15   [provider]  furosemide (LASIX) 20 MG tablet Take 20 mg by mouth 2 (two) times daily. 07/21/18   [provider]  glimepiride (AMARYL) 2 MG tablet Take 1 tablet by mouth daily. 09/09/14   [provider]  hydrochlorothiazide (HYDRODIURIL) 25 MG tablet Take 25 mg by mouth daily. 03/11/18   [provider]  levalbuterol (XOPENEX HFA) 45 MCG/ACT inhaler Inhale 1 puff into the lungs every 6 (six) hours as needed. 06/19/18   [provider]  levofloxacin (LEVAQUIN) 500 MG tablet Take 1 tablet by mouth 2 (two) times daily. 10/26/15   [provider]  losartan (COZAAR) 100 MG tablet Take 100 mg by mouth daily. 03/11/18   [provider]  metFORMIN (GLUCOPHAGE-XR) 500 MG 24 hr tablet Take 1 tablet by mouth 2 (two) times daily. 06/23/14   [provider]  simvastatin (ZOCOR) 40 MG tablet Take 40 mg by mouth daily.    [provider]  valsartan-hydrochlorothiazide (DIOVAN-HCT) 160-25 MG tablet Take 1 tablet by mouth daily. 07/11/16   Nyoka Cowden, MD  verapamil (CALAN) 120 MG tablet Take 1 tablet by mouth daily. 05/25/13   [provider]    Family History Family History  Problem Relation Age of Onset  . Brain cancer Brother   . Breast cancer Daughter   .  Breast cancer Sister     Social History Social History   Tobacco Use  . Smoking status: Never Smoker  . Smokeless tobacco: Never Used  Substance Use Topics  . Alcohol use: No  . Drug use: No     Allergies   Sitagliptin-metformin hcl; Codeine; and Penicillins   Review of Systems Review of Systems  Constitutional: Negative for chills, diaphoresis and fever.  HENT: Negative for congestion and sore throat.   Eyes: Negative for visual disturbance.  Respiratory: Negative for cough and shortness of breath.    Cardiovascular: Negative for chest pain.  Gastrointestinal: Negative for abdominal pain, nausea and vomiting.  Endocrine: Negative for cold intolerance and heat intolerance.  Genitourinary: Negative for dysuria.  Musculoskeletal: Positive for gait problem. Negative for back pain.  Skin: Positive for color change and wound. Negative for rash.  Allergic/Immunologic: Positive for immunocompromised state.  Neurological: Positive for weakness. Negative for dizziness, syncope, speech difficulty, numbness and headaches.  Hematological: Negative for adenopathy.    Physical Exam Updated Vital Signs BP 117/83   Pulse 90   Temp 97.7 F (36.5 C) (Oral)   Resp (!) 24   Ht 4\' 11"  (1.499 m)   Wt 62.6 kg   SpO2 100%   BMI 27.87 kg/m   Physical Exam Vitals signs and nursing note reviewed.  Constitutional:      General: She is not in acute distress.    Appearance: She is well-developed. She is not diaphoretic.  HENT:     Head: Normocephalic and atraumatic.  Neck:     Musculoskeletal: Normal range of motion.  Cardiovascular:     Rate and Rhythm: Regular rhythm. Tachycardia present.     Heart sounds: Normal heart sounds. No murmur. No friction rub. No gallop.      Comments: Unable to palpate pulses in lower extremities bilaterally.  Pulmonary:     Effort: Pulmonary effort is normal. No respiratory distress.     Breath sounds: Normal breath sounds. No wheezing or rales.  Abdominal:     Palpations: Abdomen is soft.     Tenderness: There is no abdominal tenderness.  Musculoskeletal: Normal range of motion.     Right lower leg: Edema present.     Left lower leg: Edema present.  Feet:     Right foot:     Skin integrity: Ulcer, skin breakdown and erythema present.     Comments: Sensation is intact in feet bilaterally. Decreased capillary refill noted on right foot. No pain upon palpation. Patient is able to move toes without difficulty. Skin:    General: Skin is warm.     Findings:  Erythema and wound (Wound noted over the right foot. Please see picture.) present. No rash.  Neurological:     Mental Status: She is alert.        ED Treatments / Results  Labs (all labs ordered are listed, but only abnormal results are displayed) Labs Reviewed  CBC WITH DIFFERENTIAL/PLATELET - Abnormal; Notable for the following components:      Result Value   WBC 16.0 (*)    RBC 5.42 (*)    Hemoglobin 10.8 (*)    MCV 68.6 (*)    MCH 19.9 (*)    MCHC 29.0 (*)    RDW 21.8 (*)    Platelets 454 (*)    Neutro Abs 13.1 (*)    All other components within normal limits  COMPREHENSIVE METABOLIC PANEL - Abnormal; Notable for the following components:   Potassium 2.8 (*)  Chloride 96 (*)    CO2 19 (*)    Glucose, Bld 225 (*)    BUN 28 (*)    Creatinine, Ser 1.34 (*)    Calcium 7.5 (*)    Total Bilirubin 2.2 (*)    GFR calc non Af Amer 39 (*)    GFR calc Af Amer 45 (*)    Anion gap >20 (*)    All other components within normal limits  LACTIC ACID, PLASMA - Abnormal; Notable for the following components:   Lactic Acid, Venous 7.7 (*)    All other components within normal limits  LACTIC ACID, PLASMA - Abnormal; Notable for the following components:   Lactic Acid, Venous 5.0 (*)    All other components within normal limits  MAGNESIUM - Abnormal; Notable for the following components:   Magnesium 1.0 (*)    All other components within normal limits  TROPONIN I - Abnormal; Notable for the following components:   Troponin I 0.12 (*)    All other components within normal limits  CULTURE, BLOOD (ROUTINE X 2)  CULTURE, BLOOD (ROUTINE X 2)  TSH  APTT  HEPARIN LEVEL (UNFRACTIONATED)  APTT    EKG EKG Interpretation  Date/Time:  Thursday July 31 2018 13:30:13 EDT Ventricular Rate:  130 PR Interval:    QRS Duration: 88 QT Interval:  256 QTC Calculation: 377 R Axis:   104 Text Interpretation:  Atrial fibrillation Right axis deviation Anteroseptal infarct, old Nonspecific  repol abnormality, diffuse leads Baseline wander in lead(s) V1 Confirmed by Benjiman Core [phone]) on 07/22/2018 1:42:24 PM   Radiology US Arterial Abi (screening Lower Extremity)  Result Date: 07/25/2018 CLINICAL DATA:  73 year old female with lower extremity edema EXAM: NONINVASIVE PHYSIOLOGIC VASCULAR STUDY OF BILATERAL LOWER EXTREMITIES TECHNIQUE: Evaluation of both lower extremities were performed at rest, including calculation of ankle-brachial indices with single level Doppler, pressure and pulse volume recording. COMPARISON:  None. FINDINGS: Right ABI:  0.7 Left ABI:  0.73 Right Lower Extremity:  Abnormal monophasic arterial waveforms Left Lower Extremity:  Abnormal monophasic arterial waveforms 0.5-0.79 Moderate PAD IMPRESSION: Depressed but relatively symmetric bilateral ankle-brachial indices at rest consistent with at least moderate underlying peripheral arterial disease. Signed, Sterling Big, MD, RPVI [facility] Radiology Electronically Signed   By: Malachy Moan M.D.   On: 07/29/2018 15:17   Dg Foot Complete Right  Result Date: 07/30/2018 CLINICAL DATA:  Right foot swelling and redness. EXAM: RIGHT FOOT COMPLETE - 3+ VIEW COMPARISON:  None. FINDINGS: No acute fracture or dislocation. No osseous destruction or periosteal reaction. Joint spaces are preserved. Mild diffuse osteopenia. Tiny plantar enthesophyte. Mild dorsal forefoot soft tissue swelling. IMPRESSION: 1. Dorsal forefoot soft tissue swelling. No acute osseous abnormality. Electronically Signed   By: Obie Dredge M.D.   On: 07/30/2018 15:27   CXR results are not crossing over, but I obtained the diagnostic report from the radiologist. Radiologist was Bary Richard MD at 07/24/2018 at 15:28.  Impression:  1. Cardiomegaly with central pulmonary vascular congestion suggesting mild CHF/volume overload. No overt alveolar pulmonary edema.  2. Probable small right  pleural effusion.   Procedures Procedures (including critical care time)  Medications Ordered in ED Medications  diltiazem (CARDIZEM) 1 mg/mL load via infusion 10 mg (10 mg Intravenous Bolus from Bag 07/10/2018 1423)    And  diltiazem (CARDIZEM) 100 mg in dextrose 5 % 100 mL (1 mg/mL) infusion (5 mg/hr Intravenous New Bag/Given 07/15/2018 1417)  potassium chloride 10 mEq in  100 mL IVPB (has no administration in time range)  potassium chloride SA (K-DUR) CR tablet 40 mEq (40 mEq Oral Refused 08/04/2018 1535)  magnesium sulfate IVPB 2 g 50 mL (has no administration in time range)  heparin ADULT infusion 100 units/mL (25000 units/212mL sodium chloride 0.45%) (has no administration in time range)  clindamycin (CLEOCIN) IVPB 600 mg (600 mg Intravenous New Bag/Given 07/24/2018 1541)     Initial Impression / Assessment and Plan / ED Course  I have reviewed the triage vital signs and the nursing notes.  Pertinent labs & imaging results that were available during my care of the patient were reviewed by me and considered in my medical decision making (see chart for details).  Clinical Course as of Jul 31 1626  Thu Jul 31, 2018  1440 Hypokalemia noted at 2.8. Will supplement potassium. Magnesium is low as well. Will supplement magnesium.  Potassium(!): 2.8 [AH]  1442 Leukocytosis noted at 16.  WBC(!): 16.0 [AH]  1523 Depressed but relatively symmetric bilateral ankle-brachial indices at rest consistent with at least moderate underlying peripheral arterial disease.    US ARTERIAL ABI (SCREENING LOWER EXTREMITY) [AH]  1529 Lactic acid is elevated at 7.7.  Lactic acid, plasma(!!) [AH]  1530 Troponin elevated at 0.12.  Troponin I(!!): 0.12 [AH]  1533 Dorsal forefoot soft tissue swelling. No acute osseous abnormality.    DG Foot Complete Right [AH]  1613 Repeat lactic acid improved to 5.  Lactic acid, plasma(!!) [AH]    Clinical Course User Index [AH] Leretha Dykes, PA-C       Patient  presents with a right foot wound and palpitations. Vitals, labs, and imaging reviewed. Unable to assess pulses with doppler. ABI reveals depressed ABI consistent with peripheral arterial disease. Will consult vascular due to concern for an occlusion. Dr. Rubin Payor spoke to Dr. Randie Heinz about case. Vascular states an occlusion is less likely with an ABI of 0.7. Vascular states a CTA of the lower extremity may be performed if necessary. Leukocytosis noted at 16. Lactic acid elevated at 7.7. Repeat lactic acid improved to 5. Started IV antibiotics due to concern for infection on foot. Patient has bilateral leg edema and suspect tachycardia was due to new onset of afib with RVR. Do not suspect severe sepsis at this time.   EKG reveals new onset afib with RVR. Provided diltiazem while in the ER. Troponin is elevated at 0.12. Patient denies chest pain or shortness of breath. Suspect troponin is elevated due to atrial fibrillation. Started heparin. Patient will need admission for new onset of afib with RVR and IV antibiotics. Consulted hospitalist. Hospitalist has agreed to admit patient.   Findings and plan of care discussed with supervising physician Dr. Rubin Payor who personally evaluated and examined this patient.   Final Clinical Impressions(s) / ED Diagnoses   Final diagnoses:  Edema  Right foot infection  Atrial fibrillation with RVR [facility])  Hypokalemia    ED Discharge Orders    None       Leretha Dykes, [place] 07/30/2018 1654    Benjiman Core, MD 08/01/18 [phone]

## 2018-07-31 NOTE — Telephone Encounter (Signed)
CONSENT READ TO PATIENT, PATIENT GAVE CONSENT  YOUR CARDIOLOGY TEAM HAS ARRANGED FOR AN E-VISIT FOR YOUR APPOINTMENT - PLEASE REVIEW IMPORTANT INFORMATION BELOW SEVERAL DAYS PRIOR TO YOUR APPOINTMENT  Due to the recent COVID-19 pandemic, we are transitioning in-person office visits to tele-medicine visits in an effort to decrease unnecessary exposure to our patients and staff. Medicare and most insurances are covering these visits without a copay needed. We also encourage you to sign up for MyChart if you have not already done so. You will need a smartphone if possible. For patients that do not have this, we can still complete the visit using a regular telephone but do prefer a smartphone to enable video when possible. You may have a close family member that lives with you that can help. If possible, we also ask that you have a blood pressure cuff and scale at home to measure your blood pressure, heart rate and weight prior to your scheduled appointment. Patients with clinical needs that need an in-person evaluation and testing will still be able to come to the office if absolutely necessary. If you have any questions, feel free to call our office.    IF YOU HAVE A SMARTPHONE, PLEASE DOWNLOAD THE WEBEX APP TO YOUR SMARTPHONE  - If Apple, go to [employer] and type in WebEx in the search bar. Download [employer], the blue/green circle. The app is free but as with any other app download, your phone may require you to verify saved payment information or Apple password. You do NOT have to create a WebEx account.  - If Android, go to [employer] and type in [employer] in the search bar. Download [employer], the blue/green circle. The app is free but as with any other app download, your phone may require you to verify saved payment information or Android password. You do NOT have to create a WebEx account.  It is very helpful to have this downloaded before your visit.    2-3 DAYS  BEFORE YOUR APPOINTMENT  You will receive a telephone call from one of our [facility] team members - your caller ID may say "Unknown caller." If this is a video visit, we will confirm that you have been able to download the WebEx app. We will remind you check your blood pressure, heart rate and weight prior to your scheduled appointment. If you have an Apple Watch or Kardia, please upload any pertinent ECG strips the day before or morning of your appointment to MyChart. Our staff will also make sure you have reviewed the consent and agree to move forward with your scheduled tele-health visit.     THE DAY OF YOUR APPOINTMENT  Approximately 15 minutes prior to your scheduled appointment, you will receive a telephone call from one of [facility] team - your caller ID may say "Unknown caller."  Our staff will confirm medications, vital signs for the day and any symptoms you may be experiencing. Please have this information available prior to the time of visit start. It may also be helpful for you to have a pad of paper and pen handy for any instructions given during your visit. They will also walk you through joining the WebEx smartphone meeting if this is a video visit.    CONSENT FOR TELE-HEALTH VISIT - PLEASE REVIEW  I hereby voluntarily request, consent and authorize [facility] and its employed or contracted physicians, physician assistants, nurse practitioners or other licensed health care professionals (the Practitioner), to provide me  with telemedicine health care services (the “Services") as deemed necessary by the treating Practitioner. I acknowledge and consent to receive the Services by the Practitioner via telemedicine. I understand that the telemedicine visit will involve communicating with the Practitioner through live audiovisual communication technology and the disclosure of certain medical information by electronic transmission. I acknowledge that I have been given the opportunity to  request an in-person assessment or other available alternative prior to the telemedicine visit and am voluntarily participating in the telemedicine visit. ° °I understand that I have the right to withhold or withdraw my consent to the use of telemedicine in the course of my care at any time, without affecting my right to future care or treatment, and that the Practitioner or I may terminate the telemedicine visit at any time. I understand that I have the right to inspect all information obtained and/or recorded in the course of the telemedicine visit and may receive copies of available information for a reasonable fee.  I understand that some of the potential risks of receiving the Services via telemedicine include:  °• Delay or interruption in medical evaluation due to technological equipment failure or disruption; °• Information transmitted may not be sufficient (e.g. poor resolution of images) to allow for appropriate medical decision making by the Practitioner; and/or  °• In rare instances, security protocols could fail, causing a breach of personal health information. ° °Furthermore, I acknowledge that it is my responsibility to provide information about my medical history, conditions and care that is complete and accurate to the best of my ability. I acknowledge that Practitioner's advice, recommendations, and/or decision may be based on factors not within their control, such as incomplete or inaccurate data provided by me or distortions of diagnostic images or specimens that may result from electronic transmissions. I understand that the practice of medicine is not an exact science and that Practitioner makes no warranties or guarantees regarding treatment outcomes. I acknowledge that I will receive a copy of this consent concurrently upon execution via email to the email address I last provided but may also request a printed copy by calling the office of [facility].   ° °I understand that my insurance  will be billed for this visit.  ° °I have read or had this consent read to me. °• I understand the contents of this consent, which adequately explains the benefits and risks of the Services being provided via telemedicine.  °• I have been provided ample opportunity to ask questions regarding this consent and the Services and have had my questions answered to my satisfaction. °• I give my informed consent for the services to be provided through the use of telemedicine in my medical care ° °By participating in this telemedicine visit I agree to the above. ° °

## 2018-07-31 NOTE — ED Notes (Signed)
Pt given diet ginger ale per request

## 2018-07-31 NOTE — ED Triage Notes (Signed)
Pt reports blister to right foot since Saturday and was sent by Dr Phillips Odor to rule out infection. Blisters have burst and now have necrotic like material

## 2018-07-31 NOTE — ED Notes (Signed)
Patient transported to Ultrasound 

## 2018-07-31 NOTE — H&P (Addendum)
[facility] History and Physical  AMAANI GUILBAULT EAV:[id number] DOB: [birth year] DOA: 07/17/2018  Referring physician: Dr Alvino Chapel PCP: Sharilyn Sites, MD   Chief Complaint: Afib w/ RVR and R foot infection  HPI: Shelly Bauer is a 73 y.o. female with hx of HTN, CVA presents to ED today with blisters on R foot x 4 days, now they have burst and have necrotic-like material.  Told to come to ED per Dr Hilma Favors (PCP).  In ED pt in afib w/ RVR, treated w/ IV diltiazem and improved.  R foot xrays neg for osteo.  Multiple electrolyte changes (low K+., low Mg++, ^AG, +lactic acid). Also tbili 2.2, other LFT's wnl.  Creat 1.3, BUN 28, glul 225.  Is diabetic. wBC 16k.  Hb 10.8 plt 454k.  We are asked to admit.   Pt just started taking lasix 1 week ago for leg swelling.  She denies any orthopnea, PND, DOE or hx of CHF or ascites.  This is first time she's taken lasix. Her feet are a little bit cold but doesn't bother here, baseline, no difficulty using feet or toes.    STates she was admitted to [facility] around 2006 for acute stroke w/ left-sided weakness. Her symptoms resolved over time.  Has HTN. No hx CHF.  Legs recently started to swell.  No claudication.   Lives w/ her husband and grandson in [place].  Grew up in [place], worked at a [place] between here and [employer].  They moved to [facility] [place] [employer] for 29yr to look after her father-in-law who was in ill health.  No etoh/ tobacco.    ROS  denies CP  no joint pain   no HA  no blurry vision  no rash  no diarrhea  no nausea/ vomiting  no dysuria  no difficulty voiding  no change in urine color    Past Medical History  Past Medical History:  Diagnosis Date  . Hypertension   . Stroke ([facility]    Past Surgical History History reviewed. No pertinent surgical history. Family History  Family History  Problem Relation Age of Onset  . Brain cancer Brother   . Breast cancer Daughter   . Breast cancer Sister    Social  History  reports that she has never smoked. She has never used smokeless tobacco. She reports that she does not drink alcohol or use drugs. Allergies  Allergies  Allergen Reactions  . Sitagliptin-Metformin Hcl     REACTION: Bilateral leg pain  . Codeine Rash  . Penicillins Rash   Home medications Prior to Admission medications   Medication Sig Start Date End Date Taking? Authorizing Provider  furosemide (LASIX) 20 MG tablet Take 20 mg by mouth 2 (two) times daily. 07/21/18  Yes [provider]  hydrochlorothiazide (HYDRODIURIL) 25 MG tablet Take 25 mg by mouth daily. 03/11/18  Yes [provider]  levalbuterol (XOPENEX HFA) 45 MCG/ACT inhaler Inhale 1 puff into the lungs every 6 (six) hours as needed. 06/19/18  Yes [provider]  losartan (COZAAR) 100 MG tablet Take 100 mg by mouth daily. 03/11/18  Yes [provider]  verapamil (CALAN) 120 MG tablet Take 1 tablet by mouth daily. 05/25/13  Yes [provider]  XARELTO 20 MG TABS tablet Take 20 mg by mouth daily. 06/19/18  Yes [provider]  aspirin 325 MG tablet Take 325 mg by mouth daily.    [provider]  benzonatate (TESSALON) 100 MG capsule Take 200  mg by mouth 3 (three) times daily as needed for cough.    [provider]  glimepiride (AMARYL) 2 MG tablet Take 1 tablet by mouth daily. 09/09/14   [provider]  metFORMIN (GLUCOPHAGE-XR) 500 MG 24 hr tablet Take 1 tablet by mouth 2 (two) times daily. 06/23/14   [provider]  simvastatin (ZOCOR) 40 MG tablet Take 40 mg by mouth daily.    [provider]   Liver Function Tests Recent Labs  Lab 07/19/2018 1350  AST 37  ALT 18  ALKPHOS 86  BILITOT 2.2*  PROT 7.3  ALBUMIN 3.6   No results for input(s): LIPASE, AMYLASE in the last 168 hours. CBC Recent Labs  Lab 07/30/2018 1350  WBC 16.0*  NEUTROABS 13.1*  HGB 10.8*  HCT 37.2  MCV 68.6*  PLT 846*   Basic Metabolic Panel Recent  Labs  Lab 08/01/2018 1350  NA 138  K 2.8*  CL 96*  CO2 19*  GLUCOSE 225*  BUN 28*  CREATININE 1.34*  CALCIUM 7.5*     Vitals:   08/02/2018 1532 07/09/2018 1533 07/17/2018 1545 07/24/2018 1552  BP: 117/83     Pulse:  95  90  Resp: (!) 22 (!) 21 (!) 24 (!) 24  Temp:      TempSrc:      SpO2:  100%  100%  Weight:      Height:       Exam: Gen BP's 98- 133/ 59- 106, RR 23   HR 52- 90 bpm No rash, cyanosis or gangrene Sclera anicteric, throat clear andmoist No jvd or bruits Chest clear bilat to bases, no rales or wheezing Cor irreg irreg no mrg Abd soft ntnd no mass or ascites +bs GU defer MS no joint effusions or deformity Ext 2+ bilat pretib edema, hip or thigh edema Both feet cold to palpation but full sensory and good strength in toes and feet R foot diffuse darkened erythema w/o drainage, 2-3 large areas of eschar/ necrosis w/o odor. Erythema ends below the ankle, no red streaking, no fluctuance.  Neuro is alert, Ox 3 , nf     Home meds:  - furosemide 20 bid/ hctz 25 qd/ losartan 100 qd/ verapamil 120 qd  - aspirin 325 qd/ simvastatin 40 qd  - metformin xr 500 qd/ glimepiride '2mg'$  qd  - xarelto 20 qd  - levalbuterol qid prn    Na 138  K+ 2.8  CO2 19  BUN 28  Cr 1.34  (creat 0.9 in 2011) Ca 7.5    AG > 20  Cl 96  Mg 1.0  Tbili 2.2  eGFR 39  Alb 3.6  AST 37  ALT 18  Glu 225   Lactic acid 7.7 > 5.0    WBC 16k  Hb 10.8   plt 454  EKG (independ reviewed) > afib VR 130, no acute, old AS MI CXR (independ reviewed) > IMPRESSION: 1. Cardiomegaly with central pulmonary vascular congestion suggesting mild CHF/volume overload. No overt alveolar pulmonary edema. 2. Probable small RIGHT pleural effusion  Xray R foot (3-View): IMPRESSION: 1. Dorsal forefoot soft tissue swelling. No acute osseous abnormality.   Assessment: 1. Sepsis / R foot acute cellulitis w/ wounds - plain films neg for osteo. Septic picture Jeri Lager, lactic) but given borderline CHF on cxr and LE edema, would  avoid any IVF boluses.  Pt clinically is not toxic after IV clinda in ED.  Low risk for MRSA, will change to IV rocephin + IV  flagyl.  ED MD discussed case w/ VVS and ABI's were done, 0.7 R and L.   2. Afib w/ RVR - new onset, possibly related to low K+/Mg+. Will consult cardiology, getting started on IV hep and dilt in ED. ECHO done today, results pending.  3. Possible CHF - borderline CXR and recent onset leg edema, no SOB. Will hold on lasix for now, get K+ and Mg++ up and get cardiology input tomorrow.   4. Hypokalemia - recently started po lasix, replacing po  5. Hypomagnesemia - recently started po lasix, replacing IV 6. HTN - BP's not high, holding home meds 7. AKI - hold losartan. Don't really have any recent creat here, last in 2011 (0.9). Get UA , Urine Na. Hold lasix for now.  8. ^TBili - other LFT's wnl, observe 9. H/o CVA - remote, symptoms resolved     Plan - as above   DVT prophylaxis: IV heparin Family communication: none here Code status: full Admit status: inpatient Bed type: SDU     Rob Doctor, hospital / [facility] [phone] 07/28/2018, 5:36 PM  If 7PM-7AM, please contact night-coverage [email or web address] Password [facility] 07/10/2018, 5:36 PM

## 2018-07-31 NOTE — Progress Notes (Signed)
ANTICOAGULATION CONSULT NOTE - Initial Consult  Pharmacy Consult for Heparin Indication: atrial fibrillation  Allergies  Allergen Reactions  . Sitagliptin-Metformin Hcl     REACTION: Bilateral leg pain  . Codeine Rash  . Penicillins Rash    Patient Measurements: Height: 4\' 11"  (149.9 cm) Weight: 138 lb (62.6 kg) IBW/kg (Calculated) : 43.2 HEPARIN DW (KG): 56.6  Vital Signs: Temp: 97.7 F (36.5 C) (04/23 1319) Temp Source: Oral (04/23 1319) BP: 117/83 (04/23 1532) Pulse Rate: 90 (04/23 1552)  Labs: Recent Labs    Aug 08, 2018 1350  HGB 10.8*  HCT 37.2  PLT 454*  CREATININE 1.34*  TROPONINI 0.12*    Estimated Creatinine Clearance: 30.1 mL/min (A) (by C-G formula based on SCr of 1.34 mg/dL (H)).   Medical History: Past Medical History:  Diagnosis Date  . Hypertension   . Stroke [facility])     Medications:  See med rec  Assessment: Patient admitted with palpitations and foot infection. She is on chronic xarelto for afib. Patient states she has not been taking her Xarelto over the past few days due to cost. Her last dose was 4/21 at 0800. Pharmacy asked to start heparin.   Goal of Therapy:  Heparin level 0.3-0.7 units/ml aPTT 66-102 seconds Monitor platelets by anticoagulation protocol: Yes   Plan:  Start heparin infusion at 800 units/hr Check basleline APTT level and then anti-Xa level and APTT in ~8 hours and daily while on heparin Continue to monitor H&H and platelets  Elder Cyphers, BS Loura Back, BCPS Clinical Pharmacist Pager [phone]  Tera Mater 08-08-2018,4:03 PM

## 2018-08-01 DIAGNOSIS — R6520 Severe sepsis without septic shock: Secondary | ICD-10-CM

## 2018-08-01 DIAGNOSIS — A419 Sepsis, unspecified organism: Principal | ICD-10-CM

## 2018-08-01 LAB — CBC
HCT: 32.1 % — ABNORMAL LOW (ref 36.0–46.0)
Hemoglobin: 9.1 g/dL — ABNORMAL LOW (ref 12.0–15.0)
MCH: 19.2 pg — ABNORMAL LOW (ref 26.0–34.0)
MCHC: 28.3 g/dL — ABNORMAL LOW (ref 30.0–36.0)
MCV: 67.7 fL — ABNORMAL LOW (ref 80.0–100.0)
Platelets: 342 10*3/uL (ref 150–400)
RBC: 4.74 MIL/uL (ref 3.87–5.11)
RDW: 21.2 % — ABNORMAL HIGH (ref 11.5–15.5)
WBC: 12.6 10*3/uL — ABNORMAL HIGH (ref 4.0–10.5)
nRBC: 0 % (ref 0.0–0.2)

## 2018-08-01 LAB — COMPREHENSIVE METABOLIC PANEL
ALT: 23 U/L (ref 0–44)
AST: 42 U/L — ABNORMAL HIGH (ref 15–41)
Albumin: 2.9 g/dL — ABNORMAL LOW (ref 3.5–5.0)
Alkaline Phosphatase: 70 U/L (ref 38–126)
Anion gap: 15 (ref 5–15)
BUN: 27 mg/dL — ABNORMAL HIGH (ref 8–23)
CO2: 24 mmol/L (ref 22–32)
Calcium: 7.2 mg/dL — ABNORMAL LOW (ref 8.9–10.3)
Chloride: 100 mmol/L (ref 98–111)
Creatinine, Ser: 1.21 mg/dL — ABNORMAL HIGH (ref 0.44–1.00)
GFR calc Af Amer: 51 mL/min — ABNORMAL LOW (ref >60)
GFR calc non Af Amer: 44 mL/min — ABNORMAL LOW (ref >60)
Glucose, Bld: 171 mg/dL — ABNORMAL HIGH (ref 70–99)
Potassium: 3 mmol/L — ABNORMAL LOW (ref 3.5–5.1)
Sodium: 139 mmol/L (ref 135–145)
Total Bilirubin: 2.1 mg/dL — ABNORMAL HIGH (ref 0.3–1.2)
Total Protein: 5.8 g/dL — ABNORMAL LOW (ref 6.5–8.1)

## 2018-08-01 LAB — MRSA PCR SCREENING: MRSA by PCR: NEGATIVE

## 2018-08-01 LAB — HEPARIN LEVEL (UNFRACTIONATED): Heparin Unfractionated: 0.16 IU/mL — ABNORMAL LOW (ref 0.30–0.70)

## 2018-08-01 LAB — APTT: aPTT: 66 seconds — ABNORMAL HIGH (ref 24–36)

## 2018-08-01 LAB — MAGNESIUM: Magnesium: 1.4 mg/dL — ABNORMAL LOW (ref 1.7–2.4)

## 2018-08-01 MED ORDER — ASPIRIN 325 MG PO TABS
325.0000 mg | ORAL_TABLET | Freq: Every day | ORAL | Status: DC
  Administered 2018-08-01 – 2018-08-02 (×2): 325 mg via ORAL
  Filled 2018-08-01 (×2): qty 1

## 2018-08-01 MED ORDER — SIMVASTATIN 20 MG PO TABS
10.0000 mg | ORAL_TABLET | Freq: Every day | ORAL | Status: DC
  Administered 2018-08-02: 10:00:00 10 mg via ORAL
  Filled 2018-08-01: qty 1

## 2018-08-01 MED ORDER — VERAPAMIL HCL 80 MG PO TABS
120.0000 mg | ORAL_TABLET | Freq: Every day | ORAL | Status: DC
  Administered 2018-08-01 – 2018-08-02 (×2): 120 mg via ORAL
  Filled 2018-08-01: qty 1
  Filled 2018-08-01: qty 2

## 2018-08-01 MED ORDER — RIVAROXABAN 20 MG PO TABS
20.0000 mg | ORAL_TABLET | Freq: Every day | ORAL | Status: DC
  Filled 2018-08-01: qty 1

## 2018-08-01 MED ORDER — POTASSIUM CHLORIDE CRYS ER 20 MEQ PO TBCR
40.0000 meq | EXTENDED_RELEASE_TABLET | Freq: Two times a day (BID) | ORAL | Status: DC
  Administered 2018-08-01 (×2): 40 meq via ORAL
  Filled 2018-08-01 (×3): qty 2

## 2018-08-01 MED ORDER — MAGNESIUM SULFATE 2 GM/50ML IV SOLN
2.0000 g | Freq: Once | INTRAVENOUS | Status: AC
  Administered 2018-08-01: 2 g via INTRAVENOUS
  Filled 2018-08-01: qty 50

## 2018-08-01 MED ORDER — LORAZEPAM 0.5 MG PO TABS
0.5000 mg | ORAL_TABLET | Freq: Four times a day (QID) | ORAL | Status: DC | PRN
  Administered 2018-08-01 – 2018-08-03 (×3): 0.5 mg via ORAL
  Filled 2018-08-01 (×3): qty 1

## 2018-08-01 MED ORDER — BENZONATATE 100 MG PO CAPS: 200.0000 mg | ORAL_CAPSULE | Freq: Three times a day (TID) | ORAL | Status: DC | PRN

## 2018-08-01 MED ORDER — RIVAROXABAN 15 MG PO TABS
15.0000 mg | ORAL_TABLET | Freq: Every day | ORAL | Status: DC
  Administered 2018-08-01 – 2018-08-02 (×2): 15 mg via ORAL
  Filled 2018-08-01 (×2): qty 1

## 2018-08-01 MED ORDER — SIMVASTATIN 20 MG PO TABS
40.0000 mg | ORAL_TABLET | Freq: Every day | ORAL | Status: DC
  Administered 2018-08-01: 40 mg via ORAL
  Filled 2018-08-01: qty 2

## 2018-08-01 NOTE — Progress Notes (Signed)
PHARMACY NOTE:  ANTICOAGULANT RENAL DOSAGE ADJUSTMENT  Current anticoagulant regimen includes a mismatch between anticoagulant dosage and estimated renal function.  As per policy approved by the Pharmacy & Therapeutics and Medical Executive Committees, the anticoagulant dosage will be adjusted accordingly.  Current anticoagulant dosage:  rivaroxaban  20mg  daily  Indication:  Atrial fibrillation  Renal Function:   Estimated Creatinine Clearance: 34.1 mL/min (A) (by C-G formula based on SCr of 1.21 mg/dL (H)). []      On intermittent HD, scheduled: []      On CRRT    Antimicoagulant dosage has been changed to:    rivaroxaban  15mg  daily   Thank you for allowing pharmacy to be a part of this patient's care.  Tama High, [facility] 08/01/2018 8:26 AM

## 2018-08-01 NOTE — Care Management Important Message (Signed)
Important Message  Patient Details  Name: Shelly Bauer MRN: [id number] Date of Birth: [birth year]   Medicare Important Message Given:  Yes    Corey Harold 08/01/2018, 12:13 PM

## 2018-08-01 NOTE — Progress Notes (Signed)
Bladder scan performed as patient has not urinated since arriving to unit. Bladder scan showed 94 mL urine. Patient denies need to urinate. Will continue to monitor.

## 2018-08-01 NOTE — Progress Notes (Signed)
ANTICOAGULATION CONSULT NOTE   Pharmacy Consult for Heparin (Xarelto on hold) Indication: atrial fibrillation  Allergies  Allergen Reactions  . Sitagliptin-Metformin Hcl     REACTION: Bilateral leg pain  . Codeine Rash  . Penicillins Rash    Patient Measurements: Height: 4\' 11"  (149.9 cm) Weight: 144 lb 2.9 oz (65.4 kg) IBW/kg (Calculated) : 43.2 HEPARIN DW (KG): 57.4  Vital Signs: Temp: 98.4 F (36.9 C) (04/23 2029) Temp Source: Oral (04/23 2029) BP: 122/107 (04/24 0030) Pulse Rate: 73 (04/24 0030)  Labs: Recent Labs    08/16/18 1350 08/16/18 1717 08/01/18 0013  HGB 10.8*  --   --   HCT 37.2  --   --   PLT 454*  --   --   APTT  --  28 66*  HEPARINUNFRC  --   --  0.16*  CREATININE 1.34*  --   --   TROPONINI 0.12*  --   --     Estimated Creatinine Clearance: 30.8 mL/min (A) (by C-G formula based on SCr of 1.34 mg/dL (H)).   Medical History: Past Medical History:  Diagnosis Date  . Hypertension   . Stroke [facility])     Medications:  See med rec  Assessment: Patient admitted with palpitations and foot infection. She is on chronic xarelto for afib. Patient states she has not been taking her Xarelto over the past few days due to cost. Her last dose was 4/21 at 0800. Pharmacy asked to start heparin.  4/24 AM update: heparin level is low, no longer need to draw aPTTs, no issues per RN.    Goal of Therapy:  Heparin level 0.3-0.7 units/mL Monitor platelets by anticoagulation protocol: Yes   Plan:  Inc heparin to 900 units/hr Re-check heparin level in 8 hours  Abran Duke, PharmD, BCPS Clinical Pharmacist Phone: [phone]

## 2018-08-01 NOTE — Progress Notes (Signed)
Redness to right foot and ankle marked for monitoring.

## 2018-08-01 NOTE — TOC Initial Note (Signed)
Transition of Care [facility](TOC) - Initial/Assessment Note    Patient Details  Name: Shelly Bauer MRN: [id number] Date of Birth: 10-Dec-1945  [facility] of [facility](TOC) CM/SW Contact:    Elliot GaultKathleen Ivyana Locey, LCSW Phone Number: 08/01/2018, 1:28 PM  Clinical Narrative:                  Spoke with pt about HH at dc as MD anticipating pt will need this at dc. Pt agreeable. [facility] requests [facility] MD anticipating dc home this weekend. Weekend TOC will be available as needed.  Expected Discharge Plan: Home w Home Health Services Barriers to Discharge: Continued Medical Work up   Patient Goals and CMS Choice Patient states their goals for this hospitalization and ongoing recovery are:: return home CMS Medicare.gov Compare Post Acute Care list provided to:: Patient Choice offered to / list presented to : Patient  Expected Discharge Plan and Services Expected Discharge Plan: Home w Home Health Services       Living arrangements for the past 2 months: Single Family Home                           [facility] Date HH Agency Contacted: 08/01/18 Time HH Agency Contacted: 1330 [facility]  Prior Living Arrangements/Services Living arrangements for the past 2 months: Single Family Home Lives with:: Spouse Patient language and need for interpreter reviewed:: Yes Do you feel safe going back to the place where you live?: Yes      Need for Family Participation in Patient Care: No (Comment) Care giver support system in place?: Yes (comment)   Criminal Activity/Legal Involvement Pertinent to Current Situation/Hospitalization: No - Comment as needed  Activities of Daily Living Home Assistive Devices/Equipment: None ADL Screening (condition at time of admission) Patient's cognitive ability adequate to safely complete daily activities?: Yes Is the patient deaf or have  difficulty hearing?: Yes Does the patient have difficulty seeing, even when wearing glasses/contacts?: No Does the patient have difficulty concentrating, remembering, or making decisions?: Yes Patient able to express need for assistance with ADLs?: Yes Does the patient have difficulty dressing or bathing?: No Independently performs ADLs?: Yes (appropriate for developmental age) Does the patient have difficulty walking or climbing stairs?: No Weakness of Legs: Both Weakness of Arms/Hands: None  Permission Sought/Granted Permission sought to share information with : [employer] Contact Representative Permission granted to share information with : Yes, Verbal Permission Granted     Permission granted to share info w AGENCY: [facility]        Emotional Assessment Appearance:: Appears stated age Attitude/Demeanor/Rapport: Engaged Affect (typically observed): Calm, Pleasant Orientation: : Oriented to Self, Oriented to Place, Oriented to  Time, Oriented to Situation Alcohol / Substance Use: Not Applicable Psych Involvement: No (comment)  Admission diagnosis:  Edema [R60.9] Hypokalemia [E87.6] Atrial fibrillation with RVR (HCC) [I48.91] Right foot infection [L08.9] Patient Active Problem List   Diagnosis Date Noted  . Sepsis (HCC) 08/05/2018  . Atrial fibrillation with RVR (HCC)   . Edema   . Hypokalemia   . Cellulitis of right foot   . SIRS (systemic inflammatory response syndrome) (HCC)   . Hypomagnesemia   . AKI (acute kidney injury) (HCC)   . Upper airway cough syndrome 06/26/2013  . DIABETES MELLITUS, TYPE II 01/31/2010  . HYPERLIPIDEMIA 01/31/2010  . OBESITY 01/31/2010  . Essential  hypertension 01/31/2010   PCP:  Assunta Found, MD Pharmacy:   [facility] [address] [place] [address] [place] [address] [place] [address] [place] [address] Phone: [phone] Fax: [phone]     Social Determinants of Health (SDOH) Interventions    Readmission Risk  Interventions Readmission Risk Prevention Plan 08/01/2018  Transportation Screening Complete  Some recent data might be hidden

## 2018-08-01 NOTE — Consult Note (Signed)
WOC Nurse wound consult note Patient receiving care in [facility] ICU02.  I have reviewed the patient's record and photo of right foot.  Per the results of the US Arterial ABI study conducted on 07/11/2018 "IMPRESSION: Depressed but relatively symmetric bilateral ankle-brachial indices at rest consistent with at least moderate underlying peripheral arterial disease...FINDINGS: Right ABI:  0.7 Left ABI:  0.73  Right Lower Extremity:  Abnormal monophasic arterial waveforms  Left Lower Extremity:  Abnormal monophasic arterial waveforms   0.5-0.79 Moderate PAD " Reason for Consult: "Right foot wounds in need of sharp debridement".  The patient is also diabetic. Wound type: From results quoted above, appears to be arterial in nature. And, the patient is diabetic.  Measurement: To be provided by her primary RN, Herbert Seta.  I spoke with her by phone, and she has agreed to provide these in the record.  Wound bed: The right great toe and third toe appear discolored purple in nature.  The right 3rd toe tip has eschar.  The right 4th toe dorsal surface just below the nail has an area of eschar.  And there is eschar at the juncture of the metatarsals and the great toe, as well as toes 4 & 5. Drainage (amount, consistency, odor) none currently Periwound: intact Dressing procedure/placement/frequency:  In a diabetic patient with arterial impairment with this type of wounding to the foot, it is in the patient' best interest to consult with Vascular Specialists to evaluate the possibility of re-vascularization of the foot.  Another consideration is a consult with Orthopedic surgery.  Performing sharp debridement to these areas in the presence of diminished blood flow, diabetes, and sepsis, would be setting the patient up for poor healing, increased area for external contaminates to invade her system, and an overall poor outcome.  My recommendations:  (1) Consult Vascular Services, and/or Orthopedic services. (2) Maintain a  "stable" eschar to these areas by applying Iodine from the Iodine swabs in clean utility to the areas each shift. (3) Protect the sites from further injury by covering with Kerlex. (4) Avoid applying any product, dressing, or substance that will soften the sites and make them "gooey".  This type of environment only contributes to microbial growth.  Monitor the wound area(s) for worsening of condition such as: Signs/symptoms of infection,  Increase in size,  Development of or worsening of odor, Development of pain, or increased pain at the affected locations.  Notify the medical team if any of these develop.  Thank you for the consult.  Discussed plan of care with the bedside nurse.  WOC nurse will not follow at this time.  Please re-consult the WOC team if needed.  Helmut Muster, RN, MSN, CWOCN, CNS-BC, pager [phone]

## 2018-08-01 NOTE — Progress Notes (Signed)
PROGRESS NOTE    Shelly Bauer  ZOX:[id number] DOB: [birth year] DOA: 08/01/2018 PCP: Assunta Found, MD   Brief Narrative:  Per HPI: Shelly Bauer is a 73 y.o. female with hx of HTN, CVA presents to ED today with blisters on R foot x 4 days, now they have burst and have necrotic-like material.  Told to come to ED per Dr Phillips Odor (PCP).  In ED pt in afib w/ RVR, treated w/ IV diltiazem and improved.  R foot xrays neg for osteo.  Multiple electrolyte changes (low K+., low Mg++, ^AG, +lactic acid). Also tbili 2.2, other LFT's wnl.  Creat 1.3, BUN 28, glul 225.  Is diabetic. wBC 16k.  Hb 10.8 plt 454k.  We are asked to admit.   Pt just started taking lasix 1 week ago for leg swelling.  She denies any orthopnea, PND, DOE or hx of CHF or ascites.  This is first time she's taken lasix. Her feet are a little bit cold but doesn't bother here, baseline, no difficulty using feet or toes.    STates she was admitted to [facility] around 2006 for acute stroke w/ left-sided weakness. Her symptoms resolved over time.  Has HTN. No hx CHF.  Legs recently started to swell.  No claudication.   Lives w/ her husband and grandson in [place].  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  They moved to [facility] [place] [facility] [place] for [place] to look after her father-in-law who was in ill health.  No etoh/ tobacco.   Patient was admitted for sepsis secondary to right foot cellulitis with wounds along with atrial fibrillation with RVR in the setting of chronic atrial fibrillation.  She is also noted to have some possible CHF.  Assessment & Plan:   Active Problems:   Sepsis (HCC)  1. Sepsis secondary to right foot cellulitis with wounds.  Films negative for osteomyelitis.  Continue on IV Rocephin and Flagyl for now and skin marked to assess for clinical improvement in a.m.  Follow cultures.  MRSA PCR negative.  Wound care nurse consultation obtained for debridement. 2. Atrial fibrillation  with RVR acute on chronic-currently in sinus rhythm.  Resume home medication of verapamil and Xarelto at this time and discontinue heparin drip and diltiazem.  Transfer to telemetry. 3. Hypokalemia-persistent.  Plan to replete orally and recheck in a.m. along with magnesium.  Monitor on telemetry.  Hold diuretics. 4. Hypomagnesemia.  Recheck level today as well as in a.m. and replete as needed. 5. Mild volume overload with possible CHF.  Patient is otherwise asymptomatic with no chest pain, shortness of breath, orthopnea and very minimal leg edema.  I will plan to hold diuretics for now as well as IV fluid.  No need for cardiology consultation today. 6. Moderate PAD.  Patient does have slightly palpable pulses on dorsalis pedal's bilaterally.  ABI confirm this and no need for vascular intervention at this time. 7. AKI versus CKD.  Creatinine is currently downtrending.  Will avoid diuretics and nephrotoxic agents for now and continue to monitor.  This should begin to improve as patient is now out of atrial fibrillation and in sinus rhythm. 8. Hypertension-controlled.  Continue to hold home medications for now and monitor closely. 9. History of CVA.  This is remote with symptom resolution.  Restart home aspirin and statin.   DVT prophylaxis: IV heparin drip changed back to home Xarelto Code Status: Full Family Communication: We will call family Disposition  Plan: Transfer to telemetry and discontinue Cardizem and heparin drip as patient is now back in sinus rhythm.  Will resume home medications.  Wound care nurse consultation for debridement and wound management.  Continue IV antibiotics and anticipate discharge in the next 24 to 48 hours and monitor cultures.   Consultants:   None  Procedures:   None  Antimicrobials:   IV Rocephin and Flagyl 4/23->  IV clindamycin 4/23   Subjective: Patient seen and evaluated today with no new acute complaints or concerns. No acute concerns or events  noted overnight.  She is converted back to sinus rhythm.  She denies any chest pain or shortness of breath.  Objective: Vitals:   08/01/18 0430 08/01/18 0500 08/01/18 0530 08/01/18 0600  BP: (!) 120/93 122/62 119/84 116/77  Pulse: 71 69 71 76  Resp: 17 17 16 20   Temp:      TempSrc:      SpO2: (!) 88% 100% 100% 100%  Weight:  65.4 kg    Height:        Intake/Output Summary (Last 24 hours) at 08/01/2018 0708 Last data filed at 08/01/2018 0401 Gross per 24 hour  Intake 555.79 ml  Output --  Net 555.79 ml   Filed Weights   07/27/2018 1316 07/11/2018 2029 08/01/18 0500  Weight: 62.6 kg 65.4 kg 65.4 kg    Examination:  General exam: Appears calm and comfortable  Respiratory system: Clear to auscultation. Respiratory effort normal.  Currently on room air Cardiovascular system: S1 & S2 heard, RRR. No JVD, murmurs, rubs, gallops or clicks. No pedal edema. Gastrointestinal system: Abdomen is nondistended, soft and nontender. No organomegaly or masses felt. Normal bowel sounds heard. Central nervous system: Alert and oriented. No focal neurological deficits. Extremities: Symmetric 5 x 5 power. Skin: No rashes, lesions or ulcers, right foot cellulitis examined and marked.  Wounds are clean and dry with some necrotic skin noted that requires debridement. Psychiatry: Judgement and insight appear normal. Mood & affect appropriate.     Data Reviewed: I have personally reviewed following labs and imaging studies  CBC: Recent Labs  Lab 07/20/2018 1350  WBC 16.0*  NEUTROABS 13.1*  HGB 10.8*  HCT 37.2  MCV 68.6*  PLT 454*   Basic Metabolic Panel: Recent Labs  Lab 08/05/2018 1350 08/01/18 0614  NA 138 139  K 2.8* 3.0*  CL 96* 100  CO2 19* 24  GLUCOSE 225* 171*  BUN 28* 27*  CREATININE 1.34* 1.21*  CALCIUM 7.5* 7.2*  MG 1.0* 1.4*   GFR: Estimated Creatinine Clearance: 34.1 mL/min (A) (by C-G formula based on SCr of 1.21 mg/dL (H)). Liver Function Tests: Recent Labs  Lab  07/29/2018 1350 08/01/18 0614  AST 37 42*  ALT 18 23  ALKPHOS 86 70  BILITOT 2.2* 2.1*  PROT 7.3 5.8*  ALBUMIN 3.6 2.9*   No results for input(s): LIPASE, AMYLASE in the last 168 hours. No results for input(s): AMMONIA in the last 168 hours. Coagulation Profile: No results for input(s): INR, PROTIME in the last 168 hours. Cardiac Enzymes: Recent Labs  Lab 07/30/2018 1350  TROPONINI 0.12*   BNP (last 3 results) No results for input(s): PROBNP in the last 8760 hours. HbA1C: No results for input(s): HGBA1C in the last 72 hours. CBG: Recent Labs  Lab 07/30/2018 2046  GLUCAP 169*   Lipid Profile: No results for input(s): CHOL, HDL, LDLCALC, TRIG, CHOLHDL, LDLDIRECT in the last 72 hours. Thyroid Function Tests: Recent Labs    08/02/2018 1350  TSH 3.012   Anemia Panel: No results for input(s): VITAMINB12, FOLATE, FERRITIN, TIBC, IRON, RETICCTPCT in the last 72 hours. Sepsis Labs: Recent Labs  Lab 08/06/2018 1350 07/22/2018 1527  LATICACIDVEN 7.7* 5.0*    Recent Results (from the past 240 hour(s))  Blood culture (routine x 2)     Status: None (Preliminary result)   Collection Time: 07/23/2018  1:50 PM  Result Value Ref Range Status   Specimen Description BLOOD RIGHT FOREARM  Final   Special Requests BOTTLES DRAWN AEROBIC AND ANAEROBIC 10 CC EACH  Final   Culture   Final    NO GROWTH < 24 HOURS Performed at [facility], [address] [place], [place] [address]    Report Status PENDING  Incomplete  Blood culture (routine x 2)     Status: None (Preliminary result)   Collection Time: 07/20/2018  3:26 PM  Result Value Ref Range Status   Specimen Description LEFT ANTECUBITAL  Final   Special Requests   Final    BOTTLES DRAWN AEROBIC AND ANAEROBIC Blood Culture adequate volume   Culture   Final    NO GROWTH < 24 HOURS Performed at [facility], [address] [place], [place] [address]    Report Status PENDING  Incomplete  MRSA PCR Screening     Status: None   Collection  Time: 07/30/2018  8:21 PM  Result Value Ref Range Status   MRSA by PCR NEGATIVE NEGATIVE Final    Comment:        The GeneXpert MRSA Assay (FDA approved for NASAL specimens only), is one component of a comprehensive MRSA colonization surveillance program. It is not intended to diagnose MRSA infection nor to guide or monitor treatment for MRSA infections. Performed at [facility], [address] [place], [place] [address]          Radiology Studies: Dg Chest 2 View  Result Date: 07/16/2018 CLINICAL DATA:  RIGHT foot infection with swelling and redness. Borderline diabetic. Heart palpitations. EXAM: CHEST - 2 VIEW COMPARISON:  Chest x-rays dated 03/30/2013 and 07/23/2008. FINDINGS: Mild cardiomegaly, possibly accentuated by hypoinspiratory changes. Suspected central pulmonary vascular congestion without overt alveolar pulmonary edema. Blunting of the RIGHT costophrenic angle suggesting small pleural effusion. No confluent opacity to suggest consolidating pneumonia. No pneumothorax seen. Osseous structures about the chest are unremarkable. IMPRESSION: 1. Cardiomegaly with central pulmonary vascular congestion suggesting mild CHF/volume overload. No overt alveolar pulmonary edema. 2. Probable small RIGHT pleural effusion. Electronically Signed   By: Bary RichardStan  Maynard M.D.   On: 07/21/2018 15:28   [place] Arterial Abi (screening Lower Extremity)  Result Date: 07/12/2018 CLINICAL DATA:  73 year old female with lower extremity edema EXAM: NONINVASIVE PHYSIOLOGIC VASCULAR STUDY OF BILATERAL LOWER EXTREMITIES TECHNIQUE: Evaluation of both lower extremities were performed at rest, including calculation of ankle-brachial indices with single level Doppler, pressure and pulse volume recording. COMPARISON:  None. FINDINGS: Right ABI:  0.7 Left ABI:  0.73 Right Lower Extremity:  Abnormal monophasic arterial waveforms Left Lower Extremity:  Abnormal monophasic arterial waveforms 0.5-0.79 Moderate PAD IMPRESSION:  Depressed but relatively symmetric bilateral ankle-brachial indices at rest consistent with at least moderate underlying peripheral arterial disease. Signed, Sterling BigHeath K. McCullough, MD, RPVI [facility] Electronically Signed   By: Malachy MoanHeath  McCullough M.D.   On: 07/19/2018 15:17   Dg Foot Complete Right  Result Date: 07/21/2018 CLINICAL DATA:  Right foot swelling and redness. EXAM: RIGHT FOOT COMPLETE - 3+ VIEW COMPARISON:  None. FINDINGS: No acute  fracture or dislocation. No osseous destruction or periosteal reaction. Joint spaces are preserved. Mild diffuse osteopenia. Tiny plantar enthesophyte. Mild dorsal forefoot soft tissue swelling. IMPRESSION: 1. Dorsal forefoot soft tissue swelling. No acute osseous abnormality. Electronically Signed   By: Obie Dredge M.D.   On: 2018-08-28 15:27        Scheduled Meds:  aspirin  325 mg Oral Daily   potassium chloride SA  40 mEq Oral Once   potassium chloride  40 mEq Oral BID   rivaroxaban  20 mg Oral Daily   simvastatin  40 mg Oral Daily   sodium chloride flush  3 mL Intravenous Q12H   verapamil  120 mg Oral Daily   Continuous Infusions:  sodium chloride     cefTRIAXone (ROCEPHIN)  IV Stopped (2018/08/28 2150)   And   metronidazole Stopped (08/01/18 0316)     LOS: 1 day    Time spent: 30 minutes    Tavin Vernet Hoover Brunette, DO [facility] Pager [phone]  If 7PM-7AM, please contact night-coverage [email or web address] Password [email or web address] 08/01/2018, 7:08 AM

## 2018-08-02 DIAGNOSIS — L089 Local infection of the skin and subcutaneous tissue, unspecified: Secondary | ICD-10-CM

## 2018-08-02 LAB — CBC
HCT: 32.5 % — ABNORMAL LOW (ref 36.0–46.0)
Hemoglobin: 9.1 g/dL — ABNORMAL LOW (ref 12.0–15.0)
MCH: 19.2 pg — ABNORMAL LOW (ref 26.0–34.0)
MCHC: 28 g/dL — ABNORMAL LOW (ref 30.0–36.0)
MCV: 68.4 fL — ABNORMAL LOW (ref 80.0–100.0)
Platelets: 331 10*3/uL (ref 150–400)
RBC: 4.75 MIL/uL (ref 3.87–5.11)
RDW: 20.9 % — ABNORMAL HIGH (ref 11.5–15.5)
WBC: 9.5 10*3/uL (ref 4.0–10.5)
nRBC: 0 % (ref 0.0–0.2)

## 2018-08-02 LAB — BASIC METABOLIC PANEL
Anion gap: 12 (ref 5–15)
Anion gap: 19 — ABNORMAL HIGH (ref 5–15)
BUN: 28 mg/dL — ABNORMAL HIGH (ref 8–23)
BUN: 28 mg/dL — ABNORMAL HIGH (ref 8–23)
CO2: 26 mmol/L (ref 22–32)
CO2: 19 mmol/L — ABNORMAL LOW (ref 22–32)
Calcium: 7.4 mg/dL — ABNORMAL LOW (ref 8.9–10.3)
Calcium: 7.6 mg/dL — ABNORMAL LOW (ref 8.9–10.3)
Chloride: 101 mmol/L (ref 98–111)
Chloride: 99 mmol/L (ref 98–111)
Creatinine, Ser: 1.16 mg/dL — ABNORMAL HIGH (ref 0.44–1.00)
Creatinine, Ser: 1.34 mg/dL — ABNORMAL HIGH (ref 0.44–1.00)
GFR calc Af Amer: 54 mL/min — ABNORMAL LOW (ref >60)
GFR calc Af Amer: 45 mL/min — ABNORMAL LOW (ref >60)
GFR calc non Af Amer: 47 mL/min — ABNORMAL LOW (ref >60)
GFR calc non Af Amer: 39 mL/min — ABNORMAL LOW (ref >60)
Glucose, Bld: 192 mg/dL — ABNORMAL HIGH (ref 70–99)
Glucose, Bld: 310 mg/dL — ABNORMAL HIGH (ref 70–99)
Potassium: 2.8 mmol/L — ABNORMAL LOW (ref 3.5–5.1)
Potassium: 3.9 mmol/L (ref 3.5–5.1)
Sodium: 139 mmol/L (ref 135–145)
Sodium: 137 mmol/L (ref 135–145)

## 2018-08-02 LAB — LACTIC ACID, PLASMA: Lactic Acid, Venous: 1.9 mmol/L (ref 0.5–1.9)

## 2018-08-02 LAB — MAGNESIUM: Magnesium: 1.8 mg/dL (ref 1.7–2.4)

## 2018-08-02 MED ORDER — POTASSIUM CHLORIDE 20 MEQ/15ML (10%) PO SOLN
40.0000 meq | Freq: Two times a day (BID) | ORAL | Status: DC
  Administered 2018-08-02: 23:00:00 40 meq via ORAL
  Filled 2018-08-02: qty 30

## 2018-08-02 MED ORDER — SODIUM CHLORIDE 0.9 % IV SOLN: 250.0000 mL | INTRAVENOUS | Status: AC | PRN

## 2018-08-02 MED ORDER — POTASSIUM CHLORIDE 10 MEQ/100ML IV SOLN
10.0000 meq | INTRAVENOUS | Status: AC
  Administered 2018-08-02 (×5): 10 meq via INTRAVENOUS
  Filled 2018-08-02 (×5): qty 100

## 2018-08-02 MED ORDER — PROMETHAZINE HCL 25 MG/ML IJ SOLN
12.5000 mg | Freq: Once | INTRAMUSCULAR | Status: AC
  Administered 2018-08-03: 12.5 mg via INTRAVENOUS
  Filled 2018-08-02: qty 1

## 2018-08-02 NOTE — Progress Notes (Signed)
Shelly Bauer, daughter, with plan of care/update. Stable, resting. Verbalized understanding.

## 2018-08-02 NOTE — Progress Notes (Signed)
PROGRESS NOTE    Shelly JettyLinda W Bauer  XBJ:[id number] DOB: 12-16-1945 DOA: 12-28-18 PCP: Assunta FoundGolding, John, MD   Brief Narrative:  Per HPI: Shelly PickingLinda W Bauer a 73 y.o.femalewith hx of HTN, CVA presents to ED today with blisters on R foot x 4 days, now they have burst and have necrotic-like material. Told to come to ED per Shelly Bauer (PCP). In ED pt in afib w/ RVR, treated w/ IV diltiazem and improved. R foot xrays neg for osteo. Multiple electrolyte changes (low K+., low Mg++, ^AG, +lactic acid). Also tbili 2.2, other LFT's wnl. Creat 1.3, BUN 28, glul 225. Is diabetic. wBC 16k. Hb 10.8 plt 454k. We are asked to admit.   Pt just started taking lasix 1 week ago for leg swelling. She denies any orthopnea, PND, DOE or hx of CHF or ascites. This is first time she's taken lasix. Her feet are a little bit cold but doesn't bother here, baseline, no difficulty using feet or toes.   STates she was admitted to [facility] around 2006 for acute stroke w/ left-sided weakness. Her symptoms resolved over time. Has HTN. No hx CHF. Legs recently started to swell. No claudication.   Lives w/ her husband and [place] in [place]. 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 [place] to look after her father-in-law who was in ill health. No etoh/ tobacco.   Patient was admitted for sepsis secondary to right foot cellulitis with wounds along with atrial fibrillation with RVR in the setting of chronic atrial fibrillation.  She is also noted to have some possible CHF.   Assessment & Plan:   Active Problems:   Sepsis (HCC)   Right foot infection   1. Sepsis secondary to right foot cellulitis with wounds-improving.  Films negative for osteomyelitis.  Continue on IV Rocephin and Flagyl for now and skin marked to assess for clinical improvement and indeed there is improvement. Follow cultures that are currently negative.  MRSA PCR  negative.    Appreciate general surgery evaluation of wound with no debridement recommended for now.  Will require outpatient follow-up with vascular surgery and CT angiography. 2. Atrial fibrillation with RVR acute on chronic-currently in sinus rhythm.  Continue home verapamil and Xarelto at this time.  Remains in sinus rhythm. 3. Hypokalemia-persistent.    Patient did not tolerate oral repletion yesterday and will try liquid form today as well as IV with recheck BMP at 1600. 4. Hypomagnesemia-resolved.  Recheck level in a.m. 5. Mild volume overload with possible CHF.  Patient is otherwise asymptomatic with no chest pain, shortness of breath, orthopnea and very minimal leg edema.  I will plan to hold diuretics for now as well as IV fluid.  No need for cardiology consultation at this time. 6. Moderate PAD.  Patient does have slightly palpable pulses on dorsalis pedal's bilaterally.  ABI confirm this and no need for vascular intervention at this time.  Will require CT angiography in the outpatient setting. 7. AKI versus CKD.  Creatinine is currently downtrending.  Will avoid diuretics and nephrotoxic agents for now and continue to monitor.  This should begin to improve as patient is now out of atrial fibrillation and in sinus rhythm. 8. Hypertension-controlled.  Continue to hold home medications for now and monitor closely. 9. History of CVA.  This is remote with symptom resolution.  Restart home aspirin and statin.   DVT prophylaxis:  Xarelto Code Status: Full  Family Communication:  Will call daughter Disposition Plan:  Continue potassium repletion with recheck this afternoon.  No debridement per general surgery.  Continue on current IV antibiotics with improvement noted.  Plan to DC with home health hopefully in a.m. once potassium improved.   Consultants:   General surgery  Procedures:   None  Antimicrobials:   IV Rocephin and Flagyl 4/23->  IV clindamycin  4/23   Subjective: Patient seen and evaluated today with no new acute complaints or concerns. No acute concerns or events noted overnight.  She is eager to go home, but continues to have persistent hypokalemia.  Right foot appears improved.  She remains in sinus rhythm.  Objective: Vitals:   08/01/18 1349 08/01/18 2100 08/02/18 0500 08/02/18 0528  BP: 131/72 127/64  122/73  Pulse: 87 86  92  Resp: Temp: 97.7 F (36.5 C) 98.1 F (36.7 C)  (!) 97.5 F (36.4 C)  TempSrc: Oral Oral  Oral  SpO2: 97% 97%  92%  Weight:   67.3 kg   Height:        Intake/Output Summary (Last 24 hours) at 08/02/2018 1138 Last data filed at 08/02/2018 0900 Gross per 24 hour  Intake 1614.97 ml  Output 400 ml  Net 1214.97 ml   Filed Weights   07/14/2018 2029 08/01/18 0500 08/02/18 0500  Weight: 65.4 kg 65.4 kg 67.3 kg    Examination:  General exam: Appears calm and comfortable  Respiratory system: Clear to auscultation. Respiratory effort normal. Cardiovascular system: S1 & S2 heard, RRR. No JVD, murmurs, rubs, gallops or clicks. No pedal edema. Gastrointestinal system: Abdomen is nondistended, soft and nontender. No organomegaly or masses felt. Normal bowel sounds heard. Central nervous system: Alert and oriented. No focal neurological deficits. Extremities: Symmetric 5 x 5 power. Skin: No rashes, lesions or ulcers, right foot erythema and wounds stable and improved. Psychiatry: Judgement and insight appear normal. Mood & affect appropriate.     Data Reviewed: I have personally reviewed following labs and imaging studies  CBC: Recent Labs  Lab 07/14/2018 1350 08/01/18 0614 08/02/18 0628  WBC 16.0* 12.6* 9.5  NEUTROABS 13.1*  --   --   HGB 10.8* 9.1* 9.1*  HCT 37.2 32.1* 32.5*  MCV 68.6* 67.7* 68.4*  PLT 454* 342 331   Basic Metabolic Panel: Recent Labs  Lab 07/17/2018 1350 08/01/18 0614 08/02/18 0628  NA 138 139 139  K 2.8* 3.0* 2.8*  CL 96* 100 101  CO2 19* 24 26   GLUCOSE 225* 171* 192*  BUN 28* 27* 28*  CREATININE 1.34* 1.21* 1.16*  CALCIUM 7.5* 7.2* 7.4*  MG 1.0* 1.4* 1.8   GFR: Estimated Creatinine Clearance: 36 mL/min (A) (by C-G formula based on SCr of 1.16 mg/dL (H)). Liver Function Tests: Recent Labs  Lab 07/13/2018 1350 08/01/18 0614  AST 37 42*  ALT 18 23  ALKPHOS 86 70  BILITOT 2.2* 2.1*  PROT 7.3 5.8*  ALBUMIN 3.6 2.9*   No results for input(s): LIPASE, AMYLASE in the last 168 hours. No results for input(s): AMMONIA in the last 168 hours. Coagulation Profile: No results for input(s): INR, PROTIME in the last 168 hours. Cardiac Enzymes: Recent Labs  Lab 07/30/2018 1350  TROPONINI 0.12*   BNP (last 3 results) No results for input(s): PROBNP in the last 8760 hours. HbA1C: No results for input(s): HGBA1C in the last 72 hours. CBG: Recent Labs  Lab 07/25/2018 2046  GLUCAP 169*   Lipid Profile: No  results for input(s): CHOL, HDL, LDLCALC, TRIG, CHOLHDL, LDLDIRECT in the last 72 hours. Thyroid Function Tests: Recent Labs    08-07-18 1350  TSH 3.012   Anemia Panel: No results for input(s): VITAMINB12, FOLATE, FERRITIN, TIBC, IRON, RETICCTPCT in the last 72 hours. Sepsis Labs: Recent Labs  Lab 08/07/18 1350 08/07/2018 1527 08/02/18 0628  LATICACIDVEN 7.7* 5.0* 1.9    Recent Results (from the past 240 hour(s))  Blood culture (routine x 2)     Status: None (Preliminary result)   Collection Time: 08-07-2018  1:50 PM  Result Value Ref Range Status   Specimen Description BLOOD RIGHT FOREARM  Final   Special Requests BOTTLES DRAWN AEROBIC AND ANAEROBIC 10 CC EACH  Final   Culture   Final    NO GROWTH 2 DAYS Performed at [facility], [address] [place], [place] [address]    Report Status PENDING  Incomplete  Blood culture (routine x 2)     Status: None (Preliminary result)   Collection Time: 08/07/18  3:26 PM  Result Value Ref Range Status   Specimen Description LEFT ANTECUBITAL  Final   Special Requests    Final    BOTTLES DRAWN AEROBIC AND ANAEROBIC Blood Culture adequate volume   Culture   Final    NO GROWTH 2 DAYS Performed at [facility], [address], [place], [place] [address]    Report Status PENDING  Incomplete  MRSA PCR Screening     Status: None   Collection Time: Aug 07, 2018  8:21 PM  Result Value Ref Range Status   MRSA by PCR NEGATIVE NEGATIVE Final    Comment:        The GeneXpert MRSA Assay (FDA approved for NASAL specimens only), is one component of a comprehensive MRSA colonization surveillance program. It is not intended to diagnose MRSA infection nor to guide or monitor treatment for MRSA infections. Performed at [facility], [address], [place], [place] [address]          Radiology Studies: Dg Chest 2 View  Result Date: 08/07/18 CLINICAL DATA:  RIGHT foot infection with swelling and redness. Borderline diabetic. Heart palpitations. EXAM: CHEST - 2 VIEW COMPARISON:  Chest x-rays dated 03/30/2013 and 07/23/2008. FINDINGS: Mild cardiomegaly, possibly accentuated by hypoinspiratory changes. Suspected central pulmonary vascular congestion without overt alveolar pulmonary edema. Blunting of the RIGHT costophrenic angle suggesting small pleural effusion. No confluent opacity to suggest consolidating pneumonia. No pneumothorax seen. Osseous structures about the chest are unremarkable. IMPRESSION: 1. Cardiomegaly with central pulmonary vascular congestion suggesting mild CHF/volume overload. No overt alveolar pulmonary edema. 2. Probable small RIGHT pleural effusion. Electronically Signed   By: Bary Richard M.D.   On: 07-Aug-2018 15:28   [place] Arterial Abi (screening Lower Extremity)  Result Date: 08/07/18 CLINICAL DATA:  73 year old female with lower extremity edema EXAM: NONINVASIVE PHYSIOLOGIC VASCULAR STUDY OF BILATERAL LOWER EXTREMITIES TECHNIQUE: Evaluation of both lower extremities were performed at rest, including calculation of ankle-brachial indices with  single level Doppler, pressure and pulse volume recording. COMPARISON:  None. FINDINGS: Right ABI:  0.7 Left ABI:  0.73 Right Lower Extremity:  Abnormal monophasic arterial waveforms Left Lower Extremity:  Abnormal monophasic arterial waveforms 0.5-0.79 Moderate PAD IMPRESSION: Depressed but relatively symmetric bilateral ankle-brachial indices at rest consistent with at least moderate underlying peripheral arterial disease. Signed, Sterling Big, MD, RPVI [facility] Electronically Signed   By: Malachy Moan M.D.   On: 08/07/18 15:17   Dg Foot Complete  Right  Result Date: 2018-08-07 CLINICAL DATA:  Right foot swelling and redness. EXAM: RIGHT FOOT COMPLETE - 3+ VIEW COMPARISON:  None. FINDINGS: No acute fracture or dislocation. No osseous destruction or periosteal reaction. Joint spaces are preserved. Mild diffuse osteopenia. Tiny plantar enthesophyte. Mild dorsal forefoot soft tissue swelling. IMPRESSION: 1. Dorsal forefoot soft tissue swelling. No acute osseous abnormality. Electronically Signed   By: Obie Dredge M.D.   On: 08/07/2018 15:27        Scheduled Meds:  aspirin  325 mg Oral Daily   potassium chloride  40 mEq Oral BID   potassium chloride SA  40 mEq Oral Once   rivaroxaban  15 mg Oral Daily   simvastatin  10 mg Oral Daily   sodium chloride flush  3 mL Intravenous Q12H   verapamil  120 mg Oral Daily   Continuous Infusions:  sodium chloride     cefTRIAXone (ROCEPHIN)  IV 2 g (08/01/18 1910)   And   metronidazole 500 mg (08/02/18 0330)   potassium chloride 10 mEq (08/02/18 1034)     LOS: 2 days    Time spent: 30 minutes    Erico Stan Hoover Brunette, DO [facility] Pager [phone]  If 7PM-7AM, please contact night-coverage [email or web address] Password [facility] 08/02/2018, 11:38 AM

## 2018-08-02 NOTE — Consult Note (Signed)
Reason for Consult: Swelling with eschars, right foot Referring Physician: Dr. Loretta Plume is an 73 y.o. female.  HPI: Patient is a 73 year old white female with multiple medical problems including CVA, hypertension, atrial fibrillation, on Xarelto who presents with a one-week history of worsening right foot swelling.  She developed some blisters along the dorsal aspect of the right foot and presented to the emergency room for further evaluation and treatment.  She today denies any pain in the right foot.  She thinks the swelling is somewhat decreased.  She denies any purulent drainage.  Past Medical History:  Diagnosis Date  . Hypertension   . Stroke [facility])     History reviewed. No pertinent surgical history.  Family History  Problem Relation Age of Onset  . Brain cancer Brother   . Breast cancer Daughter   . Breast cancer Sister     Social History:  reports that she has never smoked. She has never used smokeless tobacco. She reports that she does not drink alcohol or use drugs.  Allergies:  Allergies  Allergen Reactions  . Sitagliptin-Metformin Hcl     REACTION: Bilateral leg pain  . Codeine Rash  . Penicillins Rash    Medications: I have reviewed the patient's current medications.  Results for orders placed or performed during the hospital encounter of 07/16/2018 (from the past 48 hour(s))  CBC with Differential     Status: Abnormal   Collection Time: 07/25/2018  1:50 PM  Result Value Ref Range   WBC 16.0 (H) 4.0 - 10.5 K/uL   RBC 5.42 (H) 3.87 - 5.11 MIL/uL   Hemoglobin 10.8 (L) 12.0 - 15.0 g/dL   HCT 40.9 81.1 - 91.4 %   MCV 68.6 (L) 80.0 - 100.0 fL   MCH 19.9 (L) 26.0 - 34.0 pg   MCHC 29.0 (L) 30.0 - 36.0 g/dL   RDW 78.2 (H) 95.6 - 21.3 %   Platelets 454 (H) 150 - 400 K/uL   nRBC 0.0 0.0 - 0.2 %   Neutrophils Relative % 82 %   Neutro Abs 13.1 (H) 1.7 - 7.7 K/uL   Lymphocytes Relative 12 %   Lymphs Abs 2.0 0.7 - 4.0 K/uL   Monocytes Relative 5 %   Monocytes Absolute 0.7 0.1 - 1.0 K/uL   Eosinophils Relative 0 %   Eosinophils Absolute 0.1 0.0 - 0.5 K/uL   Basophils Relative 1 %   Basophils Absolute 0.1 0.0 - 0.1 K/uL   RBC Morphology ANISOCYTOSIS AND MICROCYTOSIS PRESENT    Immature Granulocytes 0 %   Abs Immature Granulocytes 0.07 0.00 - 0.07 K/uL    Comment: Performed at [facility], [address], [place], [place] [address]  Comprehensive metabolic panel     Status: Abnormal   Collection Time: 08/07/2018  1:50 PM  Result Value Ref Range   Sodium 138 135 - 145 mmol/L   Potassium 2.8 (L) 3.5 - 5.1 mmol/L   Chloride 96 (L) 98 - 111 mmol/L   CO2 19 (L) 22 - 32 mmol/L   Glucose, Bld 225 (H) 70 - 99 mg/dL   BUN 28 (H) 8 - 23 mg/dL   Creatinine, Ser 8.46 (H) 0.44 - 1.00 mg/dL   Calcium 7.5 (L) 8.9 - 10.3 mg/dL   Total Protein 7.3 6.5 - 8.1 g/dL   Albumin 3.6 3.5 - 5.0 g/dL   AST 37 15 - 41 U/L   ALT 18 0 - 44 U/L   Alkaline Phosphatase 86 38 -  126 U/L   Total Bilirubin 2.2 (H) 0.3 - 1.2 mg/dL   GFR calc non Af Amer 39 (L) >60 mL/min   GFR calc Af Amer 45 (L) >60 mL/min   Anion gap >20 (H) 5 - 15    Comment: Performed at [facility], [address] [place], [place] [address]  Lactic acid, plasma     Status: Abnormal   Collection Time: 07/11/2018  1:50 PM  Result Value Ref Range   Lactic Acid, Venous 7.7 (HH) 0.5 - 1.9 mmol/L    Comment: CRITICAL RESULT CALLED TO, READ BACK BY AND VERIFIED WITH: WINN[place],C@1427  BY MATTHEWS, B 4.23.2020 Performed at [facility], [address] [place], [place] [address]   Blood culture (routine x 2)     Status: None (Preliminary result)   Collection Time: 07/23/2018  1:50 PM  Result Value Ref Range   Specimen Description BLOOD RIGHT FOREARM    Special Requests BOTTLES DRAWN AEROBIC AND ANAEROBIC 10 CC EACH    Culture      NO GROWTH 2 DAYS Performed at [facility], [address] [place], [place] [address]    Report Status PENDING   Magnesium     Status: Abnormal   Collection Time:  07/25/2018  1:50 PM  Result Value Ref Range   Magnesium 1.0 (L) 1.7 - 2.4 mg/dL    Comment: Performed at [facility], [address] [place], [place] [address]  TSH     Status: None   Collection Time: 07/09/2018  1:50 PM  Result Value Ref Range   TSH 3.012 0.350 - 4.500 uIU/mL    Comment: Performed by a 3rd Generation assay with a functional sensitivity of <=0.01 uIU/mL. Performed at [facility], [address] [place], [place] [address]   Troponin I - Once     Status: Abnormal   Collection Time: 07/22/2018  1:50 PM  Result Value Ref Range   Troponin I 0.12 (HH) <0.03 ng/mL    Comment: CRITICAL RESULT CALLED TO, READ BACK BY AND VERIFIED WITH: @PATRAW ,B 1443 BY MATTHEWS,B 4.23.2020 Performed at [facility], [address] [place], [place] [address]   Sedimentation rate     Status: None   Collection Time: 07/17/2018  1:50 PM  Result Value Ref Range   Sed Rate 3 0 - 22 mm/hr    Comment: Performed at [facility], [address] [place], [place] [address]  C-reactive protein     Status: Abnormal   Collection Time: 07/13/2018  1:50 PM  Result Value Ref Range   CRP 1.8 (H) <1.0 mg/dL    Comment: Performed at [facility], [address] [place], [place] [address]  Blood culture (routine x 2)     Status: None (Preliminary result)   Collection Time: 07/26/2018  3:26 PM  Result Value Ref Range   Specimen Description LEFT ANTECUBITAL    Special Requests      BOTTLES DRAWN AEROBIC AND ANAEROBIC Blood Culture adequate volume   Culture      NO GROWTH 2 DAYS Performed at [facility], [address] [place], [place] [address]    Report Status PENDING   Lactic acid, plasma     Status: Abnormal   Collection Time: 07/23/2018  3:27 PM  Result Value Ref Range   Lactic Acid, Venous 5.0 (HH) 0.5 - 1.9 mmol/L    Comment: CRITICAL RESULT CALLED TO, READ BACK BY AND VERIFIED WITH: PATRAW,B ON 08/02/2018 AT 1610 BY LOY,C Performed at [facility], [address], [place], [place] [address]   APTT      Status: None   Collection Time: 08/27/2018  5:17 PM  Result Value Ref Range   aPTT 28 24 - 36 seconds    Comment: Performed at [facility], [address], [place], [place] [address]  MRSA PCR Screening     Status: None   Collection Time: 2018-08-27  8:21 PM  Result Value Ref Range   MRSA by PCR NEGATIVE NEGATIVE    Comment:        The GeneXpert MRSA Assay (FDA approved for NASAL specimens only), is one component of a comprehensive MRSA colonization surveillance program. It is not intended to diagnose MRSA infection nor to guide or monitor treatment for MRSA infections. Performed at [facility], [address], [place], [place] [address]   Glucose, capillary     Status: Abnormal   Collection Time: 2018-08-27  8:46 PM  Result Value Ref Range   Glucose-Capillary 169 (H) 70 - 99 mg/dL  Heparin level (unfractionated)     Status: Abnormal   Collection Time: 08/01/18 12:13 AM  Result Value Ref Range   Heparin Unfractionated 0.16 (L) 0.30 - 0.70 IU/mL    Comment: (NOTE) If heparin results are below expected values, and patient dosage has  been confirmed, suggest follow up testing of antithrombin III levels. Performed at [facility], [address] [place], [place] [address]   APTT     Status: Abnormal   Collection Time: 08/01/18 12:13 AM  Result Value Ref Range   aPTT 66 (H) 24 - 36 seconds    Comment:        IF BASELINE aPTT IS ELEVATED, SUGGEST PATIENT RISK ASSESSMENT BE USED TO DETERMINE APPROPRIATE ANTICOAGULANT THERAPY. Performed at [facility], [address] [place], [place] [address]   CBC     Status: Abnormal   Collection Time: 08/01/18  6:14 AM  Result Value Ref Range   WBC 12.6 (H) 4.0 - 10.5 K/uL   RBC 4.74 3.87 - 5.11 MIL/uL   Hemoglobin 9.1 (L) 12.0 - 15.0 g/dL   HCT 13.0 (L) 86.5 - 78.4 %   MCV 67.7 (L) 80.0 - 100.0 fL    Comment: REPEATED TO VERIFY   MCH 19.2 (L) 26.0 - 34.0 pg   MCHC 28.3 (L) 30.0 - 36.0 g/dL   RDW 69.6 (H) 29.5 - 28.4 %   Platelets 342  150 - 400 K/uL   nRBC 0.0 0.0 - 0.2 %    Comment: Performed at [facility], [address], [place], [place] [address]  Comprehensive metabolic panel     Status: Abnormal   Collection Time: 08/01/18  6:14 AM  Result Value Ref Range   Sodium 139 135 - 145 mmol/L   Potassium 3.0 (L) 3.5 - 5.1 mmol/L   Chloride 100 98 - 111 mmol/L   CO2 24 22 - 32 mmol/L   Glucose, Bld 171 (H) 70 - 99 mg/dL   BUN 27 (H) 8 - 23 mg/dL   Creatinine, Ser 0.10 (H) 0.44 - 1.00 mg/dL   Calcium 7.2 (L) 8.9 - 10.3 mg/dL   Total Protein 5.8 (L) 6.5 - 8.1 g/dL   Albumin 2.9 (L) 3.5 - 5.0 g/dL   AST 42 (H) 15 - 41 U/L   ALT 23 0 - 44 U/L   Alkaline Phosphatase 70 38 - 126 U/L   Total Bilirubin 2.1 (H) 0.3 - 1.2 mg/dL   GFR calc non Af Amer 44 (L) >  60 mL/min   GFR calc Af Amer 51 (L) >60 mL/min   Anion gap 15 5 - 15    Comment: Performed at [facility], [address], [place], [place] [address]  Magnesium     Status: Abnormal   Collection Time: 08/01/18  6:14 AM  Result Value Ref Range   Magnesium 1.4 (L) 1.7 - 2.4 mg/dL    Comment: Performed at [facility], [address], [place], [place] [address]  CBC     Status: Abnormal   Collection Time: 08/02/18  6:28 AM  Result Value Ref Range   WBC 9.5 4.0 - 10.5 K/uL   RBC 4.75 3.87 - 5.11 MIL/uL   Hemoglobin 9.1 (L) 12.0 - 15.0 g/dL   HCT 82.9 (L) 56.2 - 13.0 %   MCV 68.4 (L) 80.0 - 100.0 fL    Comment: REPEATED TO VERIFY   MCH 19.2 (L) 26.0 - 34.0 pg   MCHC 28.0 (L) 30.0 - 36.0 g/dL   RDW 86.5 (H) 78.4 - 69.6 %   Platelets 331 150 - 400 K/uL   nRBC 0.0 0.0 - 0.2 %    Comment: Performed at [facility], [address], [place], [place] [address]  Basic metabolic panel     Status: Abnormal   Collection Time: 08/02/18  6:28 AM  Result Value Ref Range   Sodium 139 135 - 145 mmol/L   Potassium 2.8 (L) 3.5 - 5.1 mmol/L   Chloride 101 98 - 111 mmol/L   CO2 26 22 - 32 mmol/L   Glucose, Bld 192 (H) 70 - 99 mg/dL   BUN 28 (H) 8 - 23 mg/dL   Creatinine, Ser  4.13 (H) 0.44 - 1.00 mg/dL   Calcium 7.4 (L) 8.9 - 10.3 mg/dL   GFR calc non Af Amer 47 (L) >60 mL/min   GFR calc Af Amer 54 (L) >60 mL/min   Anion gap 12 5 - 15    Comment: Performed at [facility], [address] [place], [place] [address]  Magnesium     Status: None   Collection Time: 08/02/18  6:28 AM  Result Value Ref Range   Magnesium 1.8 1.7 - 2.4 mg/dL    Comment: Performed at [facility], [address], [place], [place] [address]  Lactic acid, plasma     Status: None   Collection Time: 08/02/18  6:28 AM  Result Value Ref Range   Lactic Acid, Venous 1.9 0.5 - 1.9 mmol/L    Comment: Performed at [facility], [address], [place], [place] [address]    Dg Chest 2 View  Result Date: 2018/08/14 CLINICAL DATA:  RIGHT foot infection with swelling and redness. Borderline diabetic. Heart palpitations. EXAM: CHEST - 2 VIEW COMPARISON:  Chest x-rays dated 03/30/2013 and 07/23/2008. FINDINGS: Mild cardiomegaly, possibly accentuated by hypoinspiratory changes. Suspected central pulmonary vascular congestion without overt alveolar pulmonary edema. Blunting of the RIGHT costophrenic angle suggesting small pleural effusion. No confluent opacity to suggest consolidating pneumonia. No pneumothorax seen. Osseous structures about the chest are unremarkable. IMPRESSION: 1. Cardiomegaly with central pulmonary vascular congestion suggesting mild CHF/volume overload. No overt alveolar pulmonary edema. 2. Probable small RIGHT pleural effusion. Electronically Signed   By: Bary Richard M.D.   On: 2018/08/14 15:28   US Arterial Abi (screening Lower Extremity)  Result Date: Aug 14, 2018 CLINICAL DATA:  73 year old female with lower extremity edema EXAM: NONINVASIVE PHYSIOLOGIC VASCULAR STUDY OF BILATERAL LOWER EXTREMITIES TECHNIQUE: Evaluation of both lower extremities were performed at rest,  including calculation of ankle-brachial indices with single level Doppler, pressure and pulse volume recording.  COMPARISON:  None. FINDINGS: Right ABI:  0.7 Left ABI:  0.73 Right Lower Extremity:  Abnormal monophasic arterial waveforms Left Lower Extremity:  Abnormal monophasic arterial waveforms 0.5-0.79 Moderate PAD IMPRESSION: Depressed but relatively symmetric bilateral ankle-brachial indices at rest consistent with at least moderate underlying peripheral arterial disease. Signed, Sterling Big, MD, RPVI [facility] Electronically Signed   By: Malachy Moan M.D.   On: 08/06/2018 15:17   Dg Foot Complete Right  Result Date: 2018/08/06 CLINICAL DATA:  Right foot swelling and redness. EXAM: RIGHT FOOT COMPLETE - 3+ VIEW COMPARISON:  None. FINDINGS: No acute fracture or dislocation. No osseous destruction or periosteal reaction. Joint spaces are preserved. Mild diffuse osteopenia. Tiny plantar enthesophyte. Mild dorsal forefoot soft tissue swelling. IMPRESSION: 1. Dorsal forefoot soft tissue swelling. No acute osseous abnormality. Electronically Signed   By: Obie Dredge M.D.   On: August 06, 2018 15:27    ROS:  Pertinent items are noted in HPI.  Blood pressure 122/73, pulse 92, temperature (!) 97.5 F (36.4 C), temperature source Oral, resp. rate 16, height  (1.499 m), weight 67.3 kg, SpO2 92 %. Physical Exam: Pleasant white female no acute distress Head is normocephalic, atraumatic Lungs clear to auscultation with good breath sounds bilaterally Extremity examination reveals a weakly palpable right femoral artery.  Her right foot is edematous and I did not feel a dorsalis pedis pulse.  She has multiple superficial skin eschars present on the dorsum and on several digits.  She is able to move her toes and there is no loss of sensation.  No purulent drainage is noted.  Slight erythema is present.  Segmental Doppler study reviewed  Assessment/Plan: Impression: Cellulitis of the right foot most likely secondary to mild peripheral  vascular disease.  Complicating factors are her history of atrial fibrillation, electrolyte abnormalities, and possible heart failure. Patient would benefit from CT angiography of the right lower extremity.  I would not debride the superficial eschars at this time.  I am concerned that this may be a result of early ischemic changes.  Will follow with you.  Franky Macho 08/02/2018, 8:26 AM

## 2018-08-02 NOTE — Plan of Care (Signed)
  Problem: Coping: Goal: Level of anxiety will decrease Outcome: Progressing  Ativan effective.

## 2018-08-03 ENCOUNTER — Inpatient Hospital Stay (HOSPITAL_COMMUNITY): Payer: Medicare HMO

## 2018-08-03 ENCOUNTER — Encounter (HOSPITAL_COMMUNITY): Payer: Self-pay | Admitting: Internal Medicine

## 2018-08-03 DIAGNOSIS — D689 Coagulation defect, unspecified: Secondary | ICD-10-CM | POA: Diagnosis present

## 2018-08-03 DIAGNOSIS — E872 Acidosis, unspecified: Secondary | ICD-10-CM | POA: Diagnosis present

## 2018-08-03 DIAGNOSIS — J189 Pneumonia, unspecified organism: Secondary | ICD-10-CM | POA: Diagnosis present

## 2018-08-03 DIAGNOSIS — I509 Heart failure, unspecified: Secondary | ICD-10-CM

## 2018-08-03 DIAGNOSIS — J9600 Acute respiratory failure, unspecified whether with hypoxia or hypercapnia: Secondary | ICD-10-CM | POA: Diagnosis present

## 2018-08-03 DIAGNOSIS — J181 Lobar pneumonia, unspecified organism: Secondary | ICD-10-CM

## 2018-08-03 LAB — CBC WITH DIFFERENTIAL/PLATELET
Abs Immature Granulocytes: 0.1 10*3/uL — ABNORMAL HIGH (ref 0.00–0.07)
Basophils Absolute: 0 10*3/uL (ref 0.0–0.1)
Basophils Relative: 0 %
Eosinophils Absolute: 0 10*3/uL (ref 0.0–0.5)
Eosinophils Relative: 0 %
HCT: 33.1 % — ABNORMAL LOW (ref 36.0–46.0)
Hemoglobin: 9.1 g/dL — ABNORMAL LOW (ref 12.0–15.0)
Immature Granulocytes: 1 %
Lymphocytes Relative: 7 %
Lymphs Abs: 1.2 10*3/uL (ref 0.7–4.0)
MCH: 19.7 pg — ABNORMAL LOW (ref 26.0–34.0)
MCHC: 27.5 g/dL — ABNORMAL LOW (ref 30.0–36.0)
MCV: 71.8 fL — ABNORMAL LOW (ref 80.0–100.0)
Monocytes Absolute: 1 10*3/uL (ref 0.1–1.0)
Monocytes Relative: 6 %
Neutro Abs: 14.4 10*3/uL — ABNORMAL HIGH (ref 1.7–7.7)
Neutrophils Relative %: 86 %
Platelets: 373 10*3/uL (ref 150–400)
RBC: 4.61 MIL/uL (ref 3.87–5.11)
RDW: 21.6 % — ABNORMAL HIGH (ref 11.5–15.5)
WBC: 16.7 10*3/uL — ABNORMAL HIGH (ref 4.0–10.5)
nRBC: 0 % (ref 0.0–0.2)

## 2018-08-03 LAB — BLOOD GAS, ARTERIAL
Acid-base deficit: 16.4 mmol/L — ABNORMAL HIGH (ref 0.0–2.0)
Acid-base deficit: 12.8 mmol/L — ABNORMAL HIGH (ref 0.0–2.0)
Bicarbonate: 11.9 mmol/L — ABNORMAL LOW (ref 20.0–28.0)
Bicarbonate: 14.6 mmol/L — ABNORMAL LOW (ref 20.0–28.0)
FIO2: 44
FIO2: 100
O2 Saturation: 91 %
O2 Saturation: 99.7 %
Patient temperature: 37
Patient temperature: 37
pCO2 arterial: 24.7 mmHg — ABNORMAL LOW (ref 32.0–48.0)
pCO2 arterial: 25.4 mmHg — ABNORMAL LOW (ref 32.0–48.0)
pH, Arterial: 7.223 — ABNORMAL LOW (ref 7.350–7.450)
pH, Arterial: 7.305 — ABNORMAL LOW (ref 7.350–7.450)
pO2, Arterial: 82.1 mmHg — ABNORMAL LOW (ref 83.0–108.0)
pO2, Arterial: 386 mmHg — ABNORMAL HIGH (ref 83.0–108.0)

## 2018-08-03 LAB — COMPREHENSIVE METABOLIC PANEL
ALT: 22 U/L (ref 0–44)
AST: 36 U/L (ref 15–41)
Albumin: 2.8 g/dL — ABNORMAL LOW (ref 3.5–5.0)
Alkaline Phosphatase: 79 U/L (ref 38–126)
Anion gap: 24 — ABNORMAL HIGH (ref 5–15)
BUN: 28 mg/dL — ABNORMAL HIGH (ref 8–23)
CO2: 12 mmol/L — ABNORMAL LOW (ref 22–32)
Calcium: 7.3 mg/dL — ABNORMAL LOW (ref 8.9–10.3)
Chloride: 102 mmol/L (ref 98–111)
Creatinine, Ser: 1.37 mg/dL — ABNORMAL HIGH (ref 0.44–1.00)
GFR calc Af Amer: 44 mL/min — ABNORMAL LOW (ref >60)
GFR calc non Af Amer: 38 mL/min — ABNORMAL LOW (ref >60)
Glucose, Bld: 230 mg/dL — ABNORMAL HIGH (ref 70–99)
Potassium: 4 mmol/L (ref 3.5–5.1)
Sodium: 138 mmol/L (ref 135–145)
Total Bilirubin: 1.7 mg/dL — ABNORMAL HIGH (ref 0.3–1.2)
Total Protein: 6.1 g/dL — ABNORMAL LOW (ref 6.5–8.1)

## 2018-08-03 LAB — TROPONIN I
Troponin I: 0.26 ng/mL (ref <0.03)
Troponin I: 0.43 ng/mL (ref <0.03)

## 2018-08-03 LAB — LACTIC ACID, PLASMA
Lactic Acid, Venous: >11 mmol/L (ref 0.5–1.9)
Lactic Acid, Venous: >11 mmol/L (ref 0.5–1.9)

## 2018-08-03 LAB — GLUCOSE, CAPILLARY: Glucose-Capillary: 164 mg/dL — ABNORMAL HIGH (ref 70–99)

## 2018-08-03 LAB — BRAIN NATRIURETIC PEPTIDE: B Natriuretic Peptide: 1608 pg/mL — ABNORMAL HIGH (ref 0.0–100.0)

## 2018-08-03 LAB — PROCALCITONIN: Procalcitonin: 0.15 ng/mL

## 2018-08-03 LAB — PROTIME-INR
INR: 0.9 (ref 0.8–1.2)
Prothrombin Time: 60.8 seconds — ABNORMAL HIGH (ref 11.4–15.2)

## 2018-08-03 MED ORDER — LACTATED RINGERS IV BOLUS
1000.0000 mL | Freq: Once | INTRAVENOUS | Status: AC
  Administered 2018-08-03: 1000 mL via INTRAVENOUS

## 2018-08-03 MED ORDER — SODIUM CHLORIDE 0.9 % IV SOLN: 1.0000 g | Freq: Three times a day (TID) | INTRAVENOUS | Status: DC

## 2018-08-03 MED ORDER — LORAZEPAM 2 MG/ML IJ SOLN: 1.0000 mg | INTRAMUSCULAR | Status: DC | PRN

## 2018-08-03 MED ORDER — PROCHLORPERAZINE EDISYLATE 10 MG/2ML IJ SOLN: 5.0000 mg | INTRAMUSCULAR | Status: DC | PRN

## 2018-08-03 MED ORDER — VANCOMYCIN HCL 10 G IV SOLR
1500.0000 mg | Freq: Once | INTRAVENOUS | Status: AC
  Administered 2018-08-03: 05:00:00 1500 mg via INTRAVENOUS
  Filled 2018-08-03 (×2): qty 1500

## 2018-08-03 MED ORDER — LORAZEPAM 2 MG/ML IJ SOLN
0.7500 mg | Freq: Once | INTRAMUSCULAR | Status: AC
  Administered 2018-08-03: 03:00:00 0.75 mg via INTRAVENOUS
  Filled 2018-08-03: qty 1

## 2018-08-03 MED ORDER — MAGNESIUM SULFATE 2 GM/50ML IV SOLN
2.0000 g | Freq: Once | INTRAVENOUS | Status: AC
  Administered 2018-08-03: 2 g via INTRAVENOUS
  Filled 2018-08-03: qty 50

## 2018-08-03 MED ORDER — VANCOMYCIN HCL IN DEXTROSE 750-5 MG/150ML-% IV SOLN: 750.0000 mg | INTRAVENOUS | Status: DC

## 2018-08-03 MED ORDER — FUROSEMIDE 10 MG/ML IJ SOLN: 20.0000 mg | Freq: Once | INTRAMUSCULAR | Status: DC

## 2018-08-03 MED ORDER — LACTATED RINGERS IV BOLUS: 1000.0000 mL | Freq: Once | INTRAVENOUS | Status: DC

## 2018-08-03 MED ORDER — HALOPERIDOL LACTATE 5 MG/ML IJ SOLN
3.5000 mg | Freq: Once | INTRAMUSCULAR | Status: AC
  Administered 2018-08-03: 3.5 mg via INTRAVENOUS
  Filled 2018-08-03: qty 1

## 2018-08-03 MED ORDER — LACTATED RINGERS IV BOLUS
1000.0000 mL | Freq: Once | INTRAVENOUS | Status: AC
  Administered 2018-08-03: 05:00:00 1000 mL via INTRAVENOUS

## 2018-08-03 MED ORDER — NITROGLYCERIN 2 % TD OINT
0.5000 [in_us] | TOPICAL_OINTMENT | Freq: Four times a day (QID) | TRANSDERMAL | Status: AC
  Administered 2018-08-03: 06:00:00 0.5 [in_us] via TOPICAL
  Filled 2018-08-03: qty 1

## 2018-08-03 MED ORDER — VANCOMYCIN HCL IN DEXTROSE 1-5 GM/200ML-% IV SOLN: 1000.0000 mg | Freq: Once | INTRAVENOUS | Status: DC

## 2018-08-03 MED ORDER — SODIUM CHLORIDE 0.9 % IV SOLN
2.0000 g | Freq: Two times a day (BID) | INTRAVENOUS | Status: DC
  Administered 2018-08-03: 06:00:00 2 g via INTRAVENOUS
  Filled 2018-08-03: qty 2

## 2018-08-03 MED ORDER — MORPHINE SULFATE (PF) 2 MG/ML IV SOLN: 2.0000 mg | INTRAVENOUS | Status: DC | PRN

## 2018-08-03 MED ORDER — SODIUM CHLORIDE 0.9 % IV BOLUS
500.0000 mL | Freq: Once | INTRAVENOUS | Status: AC
  Administered 2018-08-03: 500 mL via INTRAVENOUS

## 2018-08-03 MED ORDER — SODIUM BICARBONATE 8.4 % IV SOLN
100.0000 meq | Freq: Once | INTRAVENOUS | Status: AC
  Administered 2018-08-03: 100 meq via INTRAVENOUS
  Filled 2018-08-03: qty 100

## 2018-08-03 MED ORDER — METRONIDAZOLE IN NACL 5-0.79 MG/ML-% IV SOLN: 500.0000 mg | Freq: Three times a day (TID) | INTRAVENOUS | Status: DC

## 2018-08-03 MED ORDER — IOHEXOL 350 MG/ML SOLN
100.0000 mL | Freq: Once | INTRAVENOUS | Status: AC | PRN
  Administered 2018-08-03: 10:00:00 100 mL via INTRAVENOUS

## 2018-08-04 ENCOUNTER — Telehealth: Payer: Medicare HMO | Admitting: Cardiovascular Disease

## 2018-08-05 LAB — CULTURE, BLOOD (ROUTINE X 2)
Culture: NO GROWTH
Culture: NO GROWTH
Special Requests: ADEQUATE

## 2018-08-08 NOTE — Progress Notes (Signed)
Night shift telemetry coverage note.  The patient was seen due to restlessness, tachycardia and respiratory distress.She has been afebrile, but has been tachycardic in the low 100s to 110s.  Respiratory rate was 29, blood pressure has been normal with most recent measurement 138/73 mmHg.  Her O2 sat are earlier in the day decreased to the mid 80s and she had her nasal cannula oxygen increased.  She is currently lethargic, but oriented to name, place and situation.  She is partially disoriented to time.  She denies headache, sore throat, chest pain, palpitations, abdominal pain and her foot is not bothering her significantly.  She received oral lorazepam earlier without significant relief of restlessness.  Haldol 3.5 mg IVP x1 dose was given which have partial action.  She was subsequently started on BiPAP ventilation and given lorazepam 0.75 mg IVP.  General: afebrile, looks acutely ill. HEENT: normocephalic, anicteric, oral mucosa is dry. Neck: Supple, no JVD. Lungs: Tachypneic in the mid 20s to low 30s.  Mildly decreased decreased breath sounds and crackles on bases, particularly on right side. Cardiovascular: S1, S2, irregularly irregular, trace lower extremity edema, particularly on the left. Abdomen: Soft nontender. Extremities: Right foot dressing in place with mild erythema. Neuro: Moves all extremities.  Grossly nonfocal.  Comprehensive metabolic panel (Abnormal)   Collected: Dec 03, 2018 0208   Updated: Dec 03, 2018 0413   Specimen Type: Blood    Sodium 138 mmol/L   Potassium 4.0 mmol/L   Chloride 102 mmol/L   CO2 12Low  mmol/L   Glucose, Bld 230High  mg/dL   BUN 16XWRU28High  mg/dL   Creatinine, Ser 1.37High  mg/dL   Calcium 0.4VWU7.3Low  mg/dL   Total Protein 9.8JXB6.1Low  g/dL   Albumin 1.4NWG2.8Low  g/dL   AST 36 U/L   ALT 22 U/L   Alkaline Phosphatase 79 U/L   Total Bilirubin 1.7High  mg/dL   GFR calc non Af Amer 38Low  mL/min   GFR calc Af Amer 44Low  mL/min   Anion gap 24High   Procalcitonin     Collected: Dec 03, 2018 0208   Updated: Dec 03, 2018 0350   Lactic acid, plasma (Abnormal)   Collected: Dec 03, 2018 0208   Updated: Dec 03, 2018 0329   Specimen Type: Blood    Lactic Acid, Venous >11.0High Panic   mmol/L  Troponin I - Once (Abnormal)   Collected: Dec 03, 2018 0208   Updated: Dec 03, 2018 0327   Specimen Type: Blood    Troponin I 0.95AOZH26High Panic   ng/mL  Protime-INR  (Abnormal)   Collected: Dec 03, 2018 0208   Updated: Dec 03, 2018 0322   Specimen Type: Blood    Prothrombin Time 60.8High  seconds   INR 7.24  Brain natriuretic peptide (Abnormal)   Collected: Dec 03, 2018 0208   Updated: Dec 03, 2018 0305   Specimen Type: Blood    B Natriuretic Peptide 1,608.0High  pg/mL  Glucose, capillary (Abnormal)   Collected: Dec 03, 2018 0253   Updated: Dec 03, 2018 0255    Glucose-Capillary 164High  mg/dL  Blood gas, arterial (Abnormal)   Collected: Dec 03, 2018 0200   Updated: Dec 03, 2018 0231   Specimen Type: Blood, Arterial   Specimen Source: Artery    FIO2 44.00   Delivery systems NO CHARGE   pH, Arterial 7.223Low    pCO2 arterial 24.7Low  mmHg   pO2, Arterial 82.1Low  mmHg   Bicarbonate 11.9Low  mmol/L   Acid-base deficit 16.4High  mmol/L   O2 Saturation 91.0 %   Patient temperature 37.0   Allens test (pass/fail) PASS  Basic metabolic panel (Abnormal)   Collected: 08/02/18 1552  Updated: 08/02/18 1635   Specimen Type: Blood    Sodium 137 mmol/L   Potassium 3.9 mmol/L   Chloride 99 mmol/L   CO2 19Low  mmol/L   Glucose, Bld 310High  mg/dL   BUN 94MHWK  mg/dL   Creatinine, Ser 1.34High  mg/dL   Calcium 0.8UPJ  mg/dL   GFR calc non Af Amer 39Low  mL/min   GFR calc Af Amer 45Low  mL/min   Anion gap 19High   Lactic acid, plasma   Collected: 08/02/18 0628   Updated: 08/02/18 0755   Specimen Type: Blood    Lactic Acid, Venous 1.9 mmol/L  Basic metabolic panel (Abnormal)   Collected: 08/02/18 0315   Updated: 08/02/18 0749   Specimen Type: Blood    Sodium 139 mmol/L   Potassium 2.8Low  mmol/L    Chloride 101 mmol/L   CO2 26 mmol/L   Glucose, Bld 192High  mg/dL   BUN 94VOPF  mg/dL   Creatinine, Ser 1.16High  mg/dL   Calcium 2.9WKM  mg/dL   GFR calc non Af Amer 47Low  mL/min   GFR calc Af Amer 54Low  mL/min   Anion gap 12  Magnesium   Collected: 08/02/18 0628   Updated: 08/02/18 0749   Specimen Type: Blood    Magnesium 1.8 mg/dL  CBC  (Abnormal)   Collected: 08/02/18 6286   Updated: 08/02/18 0737   Specimen Type: Blood    WBC 9.5 K/uL   RBC 4.75 MIL/uL   Hemoglobin 9.1Low  g/dL   HCT 38.1RRN  %   MCV 68.4Low  fL   MCH 19.2Low  pg   MCHC 28.0Low  g/dL   RDW 16.5BXUX  %   Platelets 331 K/uL   nRBC 0.0 %   Imaging: Her chest radiograph demonstrated cardiomegaly with vascular congestion, but there is a new right lower lobe opacity, when compared to the chest radiograph obtained earlier this week.  A/P  Acute respiratory failure Continue oxygen supplementation. Started BiPAP ventilation.  Right lower lobe pneumonia Switch ceftriaxone to cefepime. Vancomycin per pharmacy. Continue metronidazole to cover for aspiration. Continue BiPAP ventilation.  Acute CHF (congestive heart failure) (HCC) Due to lactic acidosis will carefully hydrate while on BiPAP ventilation. Continue BiPAP ventilation. Continue supplemental oxygen. Continue Nitropaste to ACW. Check echocardiogram.  Lactic acidosis Secondary to sepsis and hypoxia. Continue BiPAP ventilation. Careful IV hydration. Follow-up lactic acid level. Follow-up venous blood gas.  Coagulopathy (HCC) Hold Xarelto. Check CT head due to mild AMS. Will defer reversal for the moment since there is no bleeding.  Elevated troponin Likely due to demand ischemia. Trend troponin level. Echo ordered.   Over 60 minutes of critical care time have been spent during the process of this acute event.  This document was prepared using Dragon voice recognition software and may contain some unintended transcription  errors.

## 2018-08-08 NOTE — Progress Notes (Signed)
CRITICAL VALUE ALERT  Critical Value: INR 7.24  Date & Time Notied:  0325 07/28/2018   Provider Notified: Dr. Robb Matar  Orders Received/Actions taken: see chart

## 2018-08-08 NOTE — Progress Notes (Signed)
BP 128/81, HR 180

## 2018-08-08 NOTE — Progress Notes (Signed)
TOD 1203

## 2018-08-08 NOTE — Progress Notes (Signed)
Called Critical Lab at 0545 08/28/18 Lactic Acid greater than 11. Robb Matar paged.  Awaiting orders

## 2018-08-08 NOTE — Progress Notes (Signed)
MD Sherryll Burger and family at bedside at this time.

## 2018-08-08 NOTE — ED Provider Notes (Signed)
[facility]  Department of Emergency Medicine   Code Blue CONSULT NOTE  Chief Complaint: Cardiac arrest/unresponsive   Level V Caveat: Unresponsive  History of present illness: I was contacted by the hospital for a CODE BLUE cardiac arrest upstairs and presented to the patient's bedside.   The patient had a cardiac arrest in the CT scanner she was undergoing an angiogram to evaluate for arterial obstruction causing potential ischemia of her bilateral feet.  She has had lactic acid greater than 11 twice overnight, in process of attempting to get CT scan when the patient had a PEA arrest.  ROS: Unable to obtain, Level V caveat  Scheduled Meds: . potassium chloride SA  40 mEq Oral Once  . sodium chloride flush  3 mL Intravenous Q12H  . verapamil  120 mg Oral Daily   Continuous Infusions: . ceFEPime (MAXIPIME) IV Stopped (07/28/2018 19140624)  . metronidazole 500 mg (08/02/2018 0307)  . [START ON 08/04/2018] vancomycin     PRN Meds:.acetaminophen **OR** acetaminophen, benzonatate, bisacodyl, HYDROcodone-acetaminophen, hydrocortisone cream, LORazepam, prochlorperazine, senna-docusate, sodium chloride flush Past Medical History:  Diagnosis Date  . Hypertension   . Stroke [facility](HCC)    History reviewed. No pertinent surgical history. Social History   Socioeconomic History  . Marital status: Married    Spouse name: Not on file  . Number of children: Not on file  . Years of education: Not on file  . Highest education level: Not on file  Occupational History  . Occupation: Arts development officerHome Maker  Social Needs  . Financial resource strain: Not on file  . Food insecurity:    Worry: Not on file    Inability: Not on file  . Transportation needs:    Medical: Not on file    Non-medical: Not on file  Tobacco Use  . Smoking status: Never Smoker  . Smokeless tobacco: Never Used  Substance and Sexual Activity  . Alcohol use: No  . Drug use: No  . Sexual activity: Not on file  Lifestyle  .  Physical activity:    Days per week: Not on file    Minutes per session: Not on file  . Stress: Not on file  Relationships  . Social connections:    Talks on phone: Not on file    Gets together: Not on file    Attends religious service: Not on file    Active member of club or organization: Not on file    Attends meetings of clubs or organizations: Not on file    Relationship status: Not on file  . Intimate partner violence:    Fear of current or ex partner: Not on file    Emotionally abused: Not on file    Physically abused: Not on file    Forced sexual activity: Not on file  Other Topics Concern  . Not on file  Social History Narrative  . Not on file   Allergies  Allergen Reactions  . Sitagliptin-Metformin Hcl     REACTION: Bilateral leg pain  . Codeine Rash  . Penicillins Rash    Last set of Vital Signs (not current) Vitals:   07/24/2018 0800 08/06/2018 0814  BP: 125/63   Pulse: 89 93  Resp: 16 18  Temp:    SpO2: 97% 97%      Physical Exam  Gen: unresponsive Cardiovascular: pulseless  Resp: apneic. Breath sounds equal bilaterally with bagging  Abd: nondistended  Neuro: GCS 3, unresponsive to pain  HEENT: No blood in posterior pharynx,  gag reflex absent  Neck: No crepitus  Musculoskeletal: No deformity  Skin: warm feet are red with ulceration  Procedures  INTUBATION  Intubated by dr Lovell Sheehan  CRITICAL CARE Performed by: Vida Roller Total critical care time: 20 min at bedside Critical care time was exclusive of separately billable procedures and treating other patients. Critical care was necessary to treat or prevent imminent or life-threatening deterioration. Critical care was time spent personally by me on the following activities: development of treatment plan with patient and/or surrogate as well as nursing, discussions with consultants, evaluation of patient's response to treatment, examination of patient, obtaining history from patient or surrogate,  ordering and performing treatments and interventions, ordering and review of laboratory studies, ordering and review of radiographic studies, pulse oximetry and re-evaluation of patient's condition.  Cardiopulmonary Resuscitation (CPR) Procedure Note  Directed/Performed by: Vida Roller I personally directed ancillary staff and/or performed CPR in an effort to regain return of spontaneous circulation and to maintain cardiac, neuro and systemic perfusion.    Medical Decision making   Hospitalist arrived at the bedside, continued resuscitation.  I directed CPR, assisted with intubation, ventilating the patient, 2 doses of epinephrine without return of spontaneous circulation.  Prognosis is grave  Assessment and Plan   Hospitalist to assume care after initial resuscitation    Eber Hong, MD 07/24/2018 1014

## 2018-08-08 NOTE — Progress Notes (Signed)
RT extubated patient to 4L oxygen via nasal cannula for terminal weaning. RN and MD at bedside

## 2018-08-08 NOTE — Progress Notes (Signed)
73 year old female that was admitted to [facility] and I was called this morning to request transfer to [facility] for vascular evaluation which I accepted.  Hospitalist reported that she had palpable pedal pulses yesterday and today has profound ischemia to her bilateral lower extremities with no femoral pulses and a rising lactic acid >11.  I had requested a CTA abdomen pelvis with bilateral lower extremity runoff while awaiting transport to our institution.  It appears the patient had PEA arrest in the scanner and the family has made her DNR/comfort measures.  As a result she will not be transferred for vascular evaluation here at [facility].  I have discussed this with the hospitalist and appreciate the update.  Cephus Shelling, MD [facility] Office: [phone] Pager: [phone]  Cephus Shelling

## 2018-08-08 NOTE — Progress Notes (Signed)
Pharmacy Antibiotic Note  Shelly Bauer is a 73 y.o. female admitted on 07/10/2018 for right foot cellulitis.  She is on day #5 of metronidazole  Pharmacy has now  been consulted for  vancomycin dosing for pneumonia.  She's also currently on cefepime, as ceftriaxone was discontinued.  Plan: Continue cefepime 2g IV q12h  Loading dose:  vancomycin 1.5g x1 Maintenance dose:  vancomycin 750mg  IV q24h Goal vancomycin trough range:  15-20  mcg/mL Pharmacy will continue to monitor renal function, vancomycin troughs as clinically appropriate, cultures and patient progress.  Height: 4\' 11"  (149.9 cm) Weight: 149 lb 4 oz (67.7 kg) IBW/kg (Calculated) : 43.2  Temp (24hrs), Avg:97.3 F (36.3 C), Min:96.7 F (35.9 C), Max:97.8 F (36.6 C)  Recent Labs  Lab 07/11/2018 1350 07/18/2018 1527 08/01/18 0614 08/02/18 0628 08/02/18 1552 08/08/2018 0208 2018-08-08 0436 08/08/2018 0458  WBC 16.0*  --  12.6* 9.5  --   --  16.7*  --   CREATININE 1.34*  --  1.21* 1.16* 1.34* 1.37*  --   --   LATICACIDVEN 7.7* 5.0*  --  1.9  --  >11.0*  --  >11.0*    Estimated Creatinine Clearance: 30.6 mL/min (A) (by C-G formula based on SCr of 1.37 mg/dL (H)).    Allergies  Allergen Reactions  . Sitagliptin-Metformin Hcl     REACTION: Bilateral leg pain  . Codeine Rash  . Penicillins Rash    Antimicrobials this admission: Ceftriaxone 4/23 >> 4/26 Metronidazole 4/23 >>  Cefepime 4/26 >> Vancomycin 4/26 >>  Dose adjustments this admission: vancomycin  Microbiology results: 4/26  [facility] x2:  pending  4/23 MRSA PCR: negative 4/23 BC x2: NG x3 days  Thank you for allowing pharmacy to be a part of this patient's care.  Tama High Aug 08, 2018 8:42 AM

## 2018-08-08 NOTE — Discharge Summary (Signed)
Physician Discharge Summary  Shelly Bauer:[id number] DOB: [birth year] DOA: 08/08/2018  PCP: Assunta Found, MD  Admit date: 08-08-2018  Death date: 2018/08/11 1201/08/01  Admitted From:Home  Disposition:  Expired  Brief/Interim Summary: Per HPI: Shelly Bauer a 73 y.o.femalewith hx of HTN, CVA presents to ED today with blisters on R foot x 4 days, now they have burst and have necrotic-like material. Told to come to ED per Dr Phillips Odor (PCP). In ED pt in afib w/ RVR, treated w/ IV diltiazem and improved. R foot xrays neg for osteo. Multiple electrolyte changes (low K+., low Mg++, ^AG, +lactic acid). Also tbili 2.2, other LFT's wnl. Creat 1.3, BUN 28, glul 225. Is diabetic. wBC 16k. Hb 10.8 plt 454k. We are asked to admit.   Pt just started taking lasix 1 week ago for leg swelling. She denies any orthopnea, PND, DOE or hx of CHF or ascites. This is first time she's taken lasix. Her feet are a little bit cold but doesn't bother here, baseline, no difficulty using feet or toes.   STates she was admitted to [facility] around 2004/08/01 for acute stroke w/ left-sided weakness. Her symptoms resolved over time. Has HTN. No hx CHF. Legs recently started to swell. No claudication.   Patient was admitted for sepsis secondary to right foot cellulitis with wounds along with atrial fibrillation with RVR in the setting of chronic atrial fibrillation. She is also noted to have some possible CHF.  Patient's heart rate had stabilized and she was in sinus rhythm on 4/24 and therefore, transferred to telemetry floor at that time.  She appeared to be responding to her IV antibiotics for cellulitis and was seen by general surgery on 4/25 to evaluate for possible debridement and was noted to have some mild ischemic changes at that time.  She was clinically doing quite well, but continued to have some very low potassium levels for which repletion was ordered.  Unfortunately, in the early morning hours of 08-11-2022  she became very short of breath and quite confused and tachycardic.  She was noted to have profound lactic acidosis (lactic >11) and was placed on BiPAP and transferred to the ICU.  She received some sodium bicarbonate and was noted to have signs of pneumonia and heart failure on chest x-ray.  She was given IV fluid due to the lactic acidosis and antibiotics were changed to vancomycin, cefepime, and Flagyl.  On my exam later in the morning, she was noted to have some significant changes consistent with critical limb ischemia bilaterally for which I had ordered CT angiogram and had discussion with radiology regarding risks and benefits.  I subsequently had a discussion with Dr. Chestine Spore of vascular surgery who had recommended CT angiogram and transfer to [facility] for further evaluation.  Shortly after CT studies were performed, patient went into cardiac arrest with pulseless electrical activity noted.  Please see CODE BLUE note as EDP Dr. Hyacinth Meeker had responded as well as Dr. Lovell Sheehan of general surgery for intubation and immediate CPR.  Patient underwent resuscitative efforts for slightly over 20 minutes with 4 rounds of epinephrine and 2 A of bicarbonate given.  She did have return of spontaneous circulation with heart rate in the 180 to 200 bpm range with very weak and thready pulses.  Chest compressions were then discontinued and husband was called at bedside.  He stated at that time that patient would not want any aggressive efforts and would not want to be on life support and further  efforts were then discontinued.  She was transferred to ICU on mechanical ventilator and further discussion was had with family members at bedside about the futility of any further aggressive efforts.  They agreed to terminal extubation and comfort measures and patient expired shortly after extubation at 12:03 PM.   Discharge Diagnoses:  Principal Problem:   Sepsis (HCC) Active Problems:   Atrial fibrillation with RVR (HCC)    Right foot infection   Right lower lobe pneumonia (HCC)   Lactic acidosis   Acute respiratory failure (HCC)   Coagulopathy (HCC)   Acute CHF (congestive heart failure) (HCC)   Allergies  Allergen Reactions  . Sitagliptin-Metformin Hcl     REACTION: Bilateral leg pain  . Codeine Rash  . Penicillins Rash    Consultations:  Vascular Surgery Dr. Chestine Spore   Procedures/Studies: Dg Chest 2 View  Result Date: 2018/08/07 CLINICAL DATA:  RIGHT foot infection with swelling and redness. Borderline diabetic. Heart palpitations. EXAM: CHEST - 2 VIEW COMPARISON:  Chest x-rays dated 03/30/2013 and 07/23/2008. FINDINGS: Mild cardiomegaly, possibly accentuated by hypoinspiratory changes. Suspected central pulmonary vascular congestion without overt alveolar pulmonary edema. Blunting of the RIGHT costophrenic angle suggesting small pleural effusion. No confluent opacity to suggest consolidating pneumonia. No pneumothorax seen. Osseous structures about the chest are unremarkable. IMPRESSION: 1. Cardiomegaly with central pulmonary vascular congestion suggesting mild CHF/volume overload. No overt alveolar pulmonary edema. 2. Probable small RIGHT pleural effusion. Electronically Signed   By: Bary Richard M.D.   On: 07-Aug-2018 15:28   Ct Head Wo Contrast  Result Date: 08/02/2018 CLINICAL DATA:  73 year old female with history of restlessness and confusion. Decreased oxygen saturation. EXAM: CT HEAD WITHOUT CONTRAST TECHNIQUE: Contiguous axial images were obtained from the base of the skull through the vertex without intravenous contrast. COMPARISON:  No priors. FINDINGS: Brain: Large area of low attenuation in the left parietal and occipital regions, compatible with extensive encephalomalacia/gliosis from remote infarctions. Patchy and confluent areas of decreased attenuation are noted throughout the deep and periventricular white matter of the cerebral hemispheres bilaterally, compatible with chronic  microvascular ischemic disease. No evidence of acute infarction, hemorrhage, hydrocephalus, extra-axial collection or mass lesion/mass effect. Vascular: No hyperdense vessel or unexpected calcification. Skull: Normal. Negative for fracture or focal lesion. Sinuses/Orbits: No acute finding. Other: None. IMPRESSION: 1. No acute intracranial abnormalities. 2. Old left parietal-occipital infarct. 3. Mild chronic microvascular ischemic changes in the cerebral white matter, as above. Electronically Signed   By: Trudie Reed M.D.   On: 07/24/2018 12:03   Ct Chest W Contrast  Result Date: 07/23/2018 CLINICAL DATA:  73 year old female with restlessness and confusion. Open wounds on the right foot. Diminished pedal pulses. Decreased oxygen saturations. EXAM: CT CHEST WITH CONTRAST TECHNIQUE: Multidetector CT imaging of the chest was performed during intravenous contrast administration. CONTRAST:  OMNIPAQUE IOHEXOL 350 MG/ML SOLN COMPARISON:  No priors. FINDINGS: Cardiovascular: Heart size is mildly enlarged. There is no significant pericardial fluid, thickening or pericardial calcification. There is aortic atherosclerosis, as well as atherosclerosis of the great vessels of the mediastinum and the coronary arteries, including calcified atherosclerotic plaque in the left main, left anterior descending, left circumflex and right coronary arteries. Severe stenosis at the ostium of the innominate artery and the left subclavian artery. Mild calcifications of the aortic valve. Mediastinum/Nodes: No pathologically enlarged mediastinal or hilar lymph nodes. Esophagus is unremarkable in appearance. No axillary lymphadenopathy. Lungs/Pleura: Small left pleural effusion and large right pleural effusion lying dependently with some associated areas of atelectasis  in the lower lobes of the lungs bilaterally. Diffuse patchy ground-glass attenuation and interlobular septal thickening, most compatible with pulmonary edema. No  confluent consolidative airspace disease. No suspicious appearing pulmonary nodules or masses are noted. Upper Abdomen: Please see separate dictation for contemporaneously obtained CTA of the abdomen and pelvis for description of findings beneath the diaphragm. Musculoskeletal: There are no aggressive appearing lytic or blastic lesions noted in the visualized portions of the skeleton. IMPRESSION: 1. Cardiomegaly with evidence of pulmonary edema and bilateral pleural effusions, concerning for congestive heart failure. 2. Aortic atherosclerosis, in addition to left main and 3 vessel coronary artery disease. Assessment for potential risk factor modification, dietary therapy or pharmacologic therapy may be warranted, if clinically indicated. 3. There is also severe stenosis at the ostium of the innominate artery and the left subclavian artery. 4. There are calcifications of the aortic valve. Echocardiographic correlation for evaluation of potential valvular dysfunction may be warranted if clinically indicated. Aortic Atherosclerosis (ICD10-I70.0). Electronically Signed   By: Trudie Reed M.D.   On: 2018-08-19 12:09   Ct Angio Ao+bifem W & Or Wo Contrast  Result Date: 2018-08-19 CLINICAL DATA:  Bilateral critical limb ischemia with right foot cellulitis/gangrene and sudden diminished distal pulses bilaterally today with marked lactic acidosis. Noninvasive ABI evaluation 3 days ago demonstrated resting ABI of 0.7 on the right and 0.73 on the left. EXAM: CT ANGIOGRAPHY OF ABDOMINAL AORTA WITH ILIOFEMORAL RUNOFF TECHNIQUE: Multidetector CT imaging of the abdomen, pelvis and lower extremities was performed using the standard protocol during bolus administration of intravenous contrast. Multiplanar CT image reconstructions and MIPs were obtained to evaluate the vascular anatomy. CONTRAST:  OMNIPAQUE IOHEXOL 350 MG/ML SOLN COMPARISON:  ABI study on 08/02/2018 FINDINGS: VASCULAR Aorta: Calcified plaque is present  throughout much of the abdominal aorta without evidence of aneurysmal disease, significant aortic stenosis or dissection. Celiac: Calcified plaque at origin without significant stenosis. Distal branches are patent and demonstrate normal branching anatomy. SMA: Calcified plaque at origin without significant stenosis. Distal branches are patent and attenuated. Renals: Bilateral single renal arteries demonstrate no significant stenosis. IMA: The IMA origin appears to be patent. The vessel is very small and distal branches are not well opacified. Part of this appearance may be due to vasoconstriction. RIGHT Lower Extremity Inflow: Common, external and internal iliac arteries show scattered calcified plaque without significant stenosis, occlusion or aneurysmal disease. Outflow: The common femoral artery and femoral bifurcation are normally patent. Profunda femoral artery is normally patent. There is subtle, thin elongated nonocclusive filling defect beginning at roughly the juncture of the proximal and mid segments of the SFA and suggestive of some nonocclusive thrombus. The distal SFA at the adductor hiatus is nearly completely occluded by noncalcified material that is suspicious for thrombus. This may also represent noncalcified plaque. Just distal to this level is an area of similar severe stenosis of the proximal popliteal artery just beyond the adductor hiatus which is nearly occlusive. The rest of the popliteal artery is patent. Runoff: Below the knee, the posterior tibial artery is small in caliber but appears to be continuously patent into the foot. The peroneal artery is also patent to the level of the ankle mortise. The anterior tibial artery appears to be occluded proximally and may be faintly reconstituted distally. LEFT Lower Extremity Inflow: Left common, external and internal iliac arteries demonstrate scattered calcified plaque without evidence of significant stenosis, occlusion or aneurysmal disease.  Outflow: The common femoral artery is normally patent. The profunda femoral artery and  its branches demonstrate normal patency. There is significant thrombus in the left SFA beginning just beyond its origin with near occlusive thrombus present followed by a long segment of essentially occlusive thrombus in the proximal to mid SFA. This has the appearance of likely embolic thrombus as there is a rim of flow surrounding the thrombus over several cm. The SFA is reconstituted distally and the popliteal artery is normally patent. Runoff: Below the knee, evaluation is limited due to significant motion of the left lower leg at the time of imaging. The posterior tibial and peroneal arteries are likely continuously patent. The anterior tibial artery is difficult to assess but appears to be patent into the distal calf. Review of the MIP images confirms the above findings. NON-VASCULAR Lower chest: Moderate right pleural effusion and small left pleural effusion. The heart is enlarged and there is significant reflux of contrast into the intrahepatic IVC and throughout the hepatic venous system into the periphery of the liver. Findings are consistent with significantly elevated right heart pressures and underlying right heart failure. Hepatobiliary: The liver shows diffuse steatosis as well as evidence of probable cirrhosis. No evidence of biliary obstruction. The gallbladder appears unremarkable. Pancreas: Unremarkable. No pancreatic ductal dilatation or surrounding inflammatory changes. Spleen: Normal in size without focal abnormality. Adrenals/Urinary Tract: Atrophic appearing kidneys bilaterally without evidence of hydronephrosis or calculi. The bladder appears unremarkable. Stomach/Bowel: Bowel shows no evidence of obstruction, ileus or perforation. Diverticulosis of the sigmoid colon present. Lymphatic: No enlarged lymph nodes identified. Reproductive: Status post hysterectomy. No adnexal masses. Other: Small to moderate  volume ascites is scattered throughout the peritoneal cavity. There also is severe body wall anasarca. Musculoskeletal: No acute or significant osseous findings. IMPRESSION: VASCULAR 1. No significant component of aortoiliac inflow disease. 2. The right lower extremity demonstrates thin elongated nonocclusive filling defect in its proximal to mid segment and nearly occlusive thrombus distally likely consistent with embolic thrombus. Some of this may also represent noncalcified plaque. There also is concentric near occlusion of the proximal popliteal artery just below the adductor hiatus. Posterior tibial and peroneal runoff demonstrated in the right lower leg. The anterior tibial artery is occluded proximally and may reconstitute distally. 3. The left lower extremity demonstrates long segment essentially occlusive filling defect throughout the mid to distal SFA. This tubular filling defect does have some flow peripherally and has the appearance of an elongated embolic thrombus. The left SFA reconstitutes distally with normal popliteal patency. Posterior tibial and peroneal arteries are likely continuously patent. As above, left lower leg motion during acquisition limits evaluation of runoff in the left lower leg. NON-VASCULAR 1. Moderate right pleural effusion and small left pleural effusion. 2. Evidence of right heart failure and elevated right heart pressures with significant reflux of contrast into the IVC and throughout the hepatic venous system of the liver. 3. Steatosis and probable cirrhosis with associated mild to moderate ascites in the peritoneal cavity. 4. Severe anasarca of the body wall. Electronically Signed   By: Irish Lack M.D.   On: 08/11/18 12:12   US Arterial Abi (screening Lower Extremity)  Result Date: 07/14/2018 CLINICAL DATA:  73 year old female with lower extremity edema EXAM: NONINVASIVE PHYSIOLOGIC VASCULAR STUDY OF BILATERAL LOWER EXTREMITIES TECHNIQUE: Evaluation of both lower  extremities were performed at rest, including calculation of ankle-brachial indices with single level Doppler, pressure and pulse volume recording. COMPARISON:  None. FINDINGS: Right ABI:  0.7 Left ABI:  0.73 Right Lower Extremity:  Abnormal monophasic arterial waveforms Left Lower Extremity:  Abnormal monophasic arterial waveforms 0.5-0.79 Moderate PAD IMPRESSION: Depressed but relatively symmetric bilateral ankle-brachial indices at rest consistent with at least moderate underlying peripheral arterial disease. Signed, Sterling Big, MD, RPVI [facility] Electronically Signed   By: Malachy Moan M.D.   On: Aug 26, 2018 15:17   Dg Chest Port 1 View  Result Date: 07/27/2018 CLINICAL DATA:  Sepsis, shortness of breath EXAM: PORTABLE CHEST 1 VIEW COMPARISON:  08-26-2018 FINDINGS: Cardiomegaly with vascular congestion. Focal airspace opacity in the right lower lobe concerning for pneumonia. Small right effusion. No confluent opacity on the left or effusion on the left. No acute bony abnormality. IMPRESSION: Cardiomegaly, vascular congestion. Focal right lower lobe airspace opacity concerning for pneumonia. Small right effusion. Electronically Signed   By: Charlett Nose M.D.   On: 07/11/2018 02:35   Dg Foot Complete Right  Result Date: 08/26/18 CLINICAL DATA:  Right foot swelling and redness. EXAM: RIGHT FOOT COMPLETE - 3+ VIEW COMPARISON:  None. FINDINGS: No acute fracture or dislocation. No osseous destruction or periosteal reaction. Joint spaces are preserved. Mild diffuse osteopenia. Tiny plantar enthesophyte. Mild dorsal forefoot soft tissue swelling. IMPRESSION: 1. Dorsal forefoot soft tissue swelling. No acute osseous abnormality. Electronically Signed   By: Obie Dredge M.D.   On: 2018/08/26 15:27     The results of significant diagnostics from this hospitalization (including imaging, microbiology, ancillary and laboratory) are  listed below for reference.     Microbiology: Recent Results (from the past 240 hour(s))  Blood culture (routine x 2)     Status: None (Preliminary result)   Collection Time: 08/26/18  1:50 PM  Result Value Ref Range Status   Specimen Description BLOOD RIGHT FOREARM  Final   Special Requests BOTTLES DRAWN AEROBIC AND ANAEROBIC 10 CC EACH  Final   Culture   Final    NO GROWTH 3 DAYS Performed at [facility], [address] [place], [place] [address]    Report Status PENDING  Incomplete  Blood culture (routine x 2)     Status: None (Preliminary result)   Collection Time: Aug 26, 2018  3:26 PM  Result Value Ref Range Status   Specimen Description LEFT ANTECUBITAL  Final   Special Requests   Final    BOTTLES DRAWN AEROBIC AND ANAEROBIC Blood Culture adequate volume   Culture   Final    NO GROWTH 3 DAYS Performed at [facility], [address], [place], [place] [address]    Report Status PENDING  Incomplete  MRSA PCR Screening     Status: None   Collection Time: 08-26-2018  8:21 PM  Result Value Ref Range Status   MRSA by PCR NEGATIVE NEGATIVE Final    Comment:        The GeneXpert MRSA Assay (FDA approved for NASAL specimens only), is one component of a comprehensive MRSA colonization surveillance program. It is not intended to diagnose MRSA infection nor to guide or monitor treatment for MRSA infections. Performed at [facility], [address], [place], [place] [address]   Culture, blood (x 2)     Status: None (Preliminary result)   Collection Time: 07/13/2018  7:51 AM  Result Value Ref Range Status   Specimen Description   Final    RIGHT ANTECUBITAL BOTTLES DRAWN AEROBIC AND ANAEROBIC   Special Requests   Final    Blood Culture adequate volume Performed at [facility], [address], [place], [place] [address]    Culture PENDING  Incomplete  Report Status PENDING  Incomplete  Culture, blood (x 2)     Status: None (Preliminary result)   Collection Time: 07/17/2018  7:51  AM  Result Value Ref Range Status   Specimen Description   Final    BLOOD RIGHT HAND BOTTLES DRAWN AEROBIC AND ANAEROBIC   Special Requests   Final    Blood Culture adequate volume Performed at [facility], [address], [place], [place] [address]    Culture PENDING  Incomplete   Report Status PENDING  Incomplete     Labs: BNP (last 3 results) Recent Labs    07/30/2018 0208  BNP 1,608.0*   Basic Metabolic Panel: Recent Labs  Lab 02-Aug-2018 1350 08/01/18 0614 08/02/18 0628 08/02/18 1552 07/25/2018 0208  NA 138 139 139 137 138  K 2.8* 3.0* 2.8* 3.9 4.0  CL 96* 100 101 99 102  CO2 19* 24 26 19* 12*  GLUCOSE 225* 171* 192* 310* 230*  BUN 28* 27* 28* 28* 28*  CREATININE 1.34* 1.21* 1.16* 1.34* 1.37*  CALCIUM 7.5* 7.2* 7.4* 7.6* 7.3*  MG 1.0* 1.4* 1.8  --   --    Liver Function Tests: Recent Labs  Lab 02-Aug-2018 1350 08/01/18 0614 08/01/2018 0208  AST 37 42* 36  ALT ALKPHOS 86 70 79  BILITOT 2.2* 2.1* 1.7*  PROT 7.3 5.8* 6.1*  ALBUMIN 3.6 2.9* 2.8*   No results for input(s): LIPASE, AMYLASE in the last 168 hours. No results for input(s): AMMONIA in the last 168 hours. CBC: Recent Labs  Lab August 02, 2018 1350 08/01/18 0614 08/02/18 0628 07/30/2018 0436  WBC 16.0* 12.6* 9.5 16.7*  NEUTROABS 13.1*  --   --  14.4*  HGB 10.8* 9.1* 9.1* 9.1*  HCT 37.2 32.1* 32.5* 33.1*  MCV 68.6* 67.7* 68.4* 71.8*  PLT 454* 342 331 373   Cardiac Enzymes: Recent Labs  Lab 08/02/18 1350 07/12/2018 0208 08/01/2018 0751  TROPONINI 0.12* 0.26* 0.43*   BNP: Invalid input(s): POCBNP CBG: Recent Labs  Lab 02-Aug-2018 2046 07/10/2018 0253  GLUCAP 169* 164*   D-Dimer No results for input(s): DDIMER in the last 72 hours. Hgb A1c No results for input(s): HGBA1C in the last 72 hours. Lipid Profile No results for input(s): CHOL, HDL, LDLCALC, TRIG, CHOLHDL, LDLDIRECT in the last 72 hours. Thyroid function studies Recent Labs    08/02/2018 1350  TSH 3.012   Anemia work up No  results for input(s): VITAMINB12, FOLATE, FERRITIN, TIBC, IRON, RETICCTPCT in the last 72 hours. Urinalysis No results found for: COLORURINE, APPEARANCEUR, LABSPEC, PHURINE, GLUCOSEU, HGBUR, BILIRUBINUR, KETONESUR, PROTEINUR, UROBILINOGEN, NITRITE, LEUKOCYTESUR Sepsis Labs Invalid input(s): PROCALCITONIN,  WBC,  LACTICIDVEN Microbiology Recent Results (from the past 240 hour(s))  Blood culture (routine x 2)     Status: None (Preliminary result)   Collection Time: 02-Aug-2018  1:50 PM  Result Value Ref Range Status   Specimen Description BLOOD RIGHT FOREARM  Final   Special Requests BOTTLES DRAWN AEROBIC AND ANAEROBIC 10 CC EACH  Final   Culture   Final    NO GROWTH 3 DAYS Performed at [facility], [address], [place], [place] [address]    Report Status PENDING  Incomplete  Blood culture (routine x 2)     Status: None (Preliminary result)   Collection Time: August 02, 2018  3:26 PM  Result Value Ref Range Status   Specimen Description LEFT ANTECUBITAL  Final   Special Requests   Final    BOTTLES DRAWN AEROBIC AND ANAEROBIC Blood Culture  adequate volume   Culture   Final    NO GROWTH 3 DAYS Performed at [facility], [address] [place], [place] [address]    Report Status PENDING  Incomplete  MRSA PCR Screening     Status: None   Collection Time: 07/12/18  8:21 PM  Result Value Ref Range Status   MRSA by PCR NEGATIVE NEGATIVE Final    Comment:        The GeneXpert MRSA Assay (FDA approved for NASAL specimens only), is one component of a comprehensive MRSA colonization surveillance program. It is not intended to diagnose MRSA infection nor to guide or monitor treatment for MRSA infections. Performed at [facility], [address] [place], [place] [address]   Culture, blood (x 2)     Status: None (Preliminary result)   Collection Time: 07/25/2018  7:51 AM  Result Value Ref Range Status   Specimen Description   Final    RIGHT ANTECUBITAL BOTTLES DRAWN AEROBIC AND ANAEROBIC    Special Requests   Final    Blood Culture adequate volume Performed at [facility], [address] [place], [place] [address]    Culture PENDING  Incomplete   Report Status PENDING  Incomplete  Culture, blood (x 2)     Status: None (Preliminary result)   Collection Time: 07/22/2018  7:51 AM  Result Value Ref Range Status   Specimen Description   Final    BLOOD RIGHT HAND BOTTLES DRAWN AEROBIC AND ANAEROBIC   Special Requests   Final    Blood Culture adequate volume Performed at [facility], [address] [place], [place] [id number]    Culture PENDING  Incomplete   Report Status PENDING  Incomplete     Time coordinating discharge: 40 minutes  SIGNED:   Erick BlinksPratik D Dellanira Dillow, DO [facility] 07/21/2018, 12:35 PM  If 7PM-7AM, please contact night-coverage [email or web address] Password [email or web address]

## 2018-08-08 NOTE — Progress Notes (Signed)
ANTIBIOTIC CONSULT NOTE-Preliminary  Pharmacy Consult for Vancomycin  Indication: pneumonia  Allergies  Allergen Reactions  . Sitagliptin-Metformin Hcl     REACTION: Bilateral leg pain  . Codeine Rash  . Penicillins Rash    Patient Measurements: Height: 4\' 11"  (149.9 cm) Weight: 148 lb 5.9 oz (67.3 kg) IBW/kg (Calculated) : 43.2 kg   Vital Signs: BP: 122/64 (04/25 2114) Pulse Rate: 115 (04/26 0247)  Labs: Recent Labs    2018-08-02 1350 08/01/18 0614 08/02/18 0628 08/02/18 1552  WBC 16.0* 12.6* 9.5  --   HGB 10.8* 9.1* 9.1*  --   PLT 454* 342 331  --   CREATININE 1.34* 1.21* 1.16* 1.34*    Estimated Creatinine Clearance: 31.2 mL/min (A) (by C-G formula based on SCr of 1.34 mg/dL (H)).  No results for input(s): VANCOTROUGH, VANCOPEAK, VANCORANDOM, GENTTROUGH, GENTPEAK, GENTRANDOM, TOBRATROUGH, TOBRAPEAK, TOBRARND, AMIKACINPEAK, AMIKACINTROU, AMIKACIN in the last 72 hours.   Chest Xray concerning for right lower lobe pneumonia  Microbiology: Recent Results (from the past 720 hour(s))  Blood culture (routine x 2)     Status: None (Preliminary result)   Collection Time: 2018/08/02  1:50 PM  Result Value Ref Range Status   Specimen Description BLOOD RIGHT FOREARM  Final   Special Requests BOTTLES DRAWN AEROBIC AND ANAEROBIC 10 CC EACH  Final   Culture   Final    NO GROWTH 2 DAYS Performed at [facility], [address], [place], [place] [address]    Report Status PENDING  Incomplete  Blood culture (routine x 2)     Status: None (Preliminary result)   Collection Time: 2018-08-02  3:26 PM  Result Value Ref Range Status   Specimen Description LEFT ANTECUBITAL  Final   Special Requests   Final    BOTTLES DRAWN AEROBIC AND ANAEROBIC Blood Culture adequate volume   Culture   Final    NO GROWTH 2 DAYS Performed at [facility], [address], [place], [place] [address]    Report Status PENDING  Incomplete  MRSA PCR Screening     Status: None   Collection Time: 08/02/18   8:21 PM  Result Value Ref Range Status   MRSA by PCR NEGATIVE NEGATIVE Final    Comment:        The GeneXpert MRSA Assay (FDA approved for NASAL specimens only), is one component of a comprehensive MRSA colonization surveillance program. It is not intended to diagnose MRSA infection nor to guide or monitor treatment for MRSA infections. Performed at [facility], [address] [place], [place] [address]     Medical History: Past Medical History:  Diagnosis Date  . Hypertension   . Stroke [facility])     Medications:  Scheduled:  . aspirin  325 mg Oral Daily  . furosemide  20 mg Intravenous Once  . nitroGLYCERIN  0.5 inch Topical Q6H  . potassium chloride  40 mEq Oral BID  . potassium chloride SA  40 mEq Oral Once  . rivaroxaban  15 mg Oral Daily  . simvastatin  10 mg Oral Daily  . sodium chloride flush  3 mL Intravenous Q12H  . verapamil  120 mg Oral Daily    Anti-infectives (From admission, onward)   Start     Dose/Rate Route Frequency Ordered Stop   08/07/2018 0600  ceFEPIme (MAXIPIME) 1 g in sodium chloride 0.9 % 100 mL IVPB  Status:  Discontinued     1 g 200 mL/hr over 30 Minutes Intravenous Every 8 hours 08/01/2018 0301 07/20/2018  [id number]   06/19/18 0400  ceFEPIme (MAXIPIME) 2 g in sodium chloride 0.9 % 100 mL IVPB    Note to Pharmacy:  Thank you for your input.   2 g 200 mL/hr over 30 Minutes Intravenous Every 12 hours 06/19/18 0317     06/19/18 0330  vancomycin (VANCOCIN) 1,500 mg in sodium chloride 0.9 % 500 mL IVPB     1,500 mg 250 mL/hr over 120 Minutes Intravenous  Once 06/19/18 0318     06/19/18 0315  vancomycin (VANCOCIN) IVPB 1000 mg/200 mL premix  Status:  Discontinued     1,000 mg 200 mL/hr over 60 Minutes Intravenous  Once 06/19/18 0301 06/19/18 0318   06/19/18 0315  metroNIDAZOLE (FLAGYL) IVPB 500 mg  Status:  Discontinued     500 mg 100 mL/hr over 60 Minutes Intravenous Every 8 hours 06/19/18 0301 06/19/18 0319   07/30/2018 1900  cefTRIAXone (ROCEPHIN) 2 g in  sodium chloride 0.9 % 100 mL IVPB  Status:  Discontinued     2 g 200 mL/hr over 30 Minutes Intravenous Every 24 hours 07/13/2018 1831 06/19/18 0301   07/17/2018 1845  metroNIDAZOLE (FLAGYL) IVPB 500 mg     500 mg 100 mL/hr over 60 Minutes Intravenous Every 8 hours 07/10/2018 1831     08/01/2018 1430  clindamycin (CLEOCIN) IVPB 600 mg     600 mg 100 mL/hr over 30 Minutes Intravenous  Once 07/22/2018 1426 07/16/2018 1611      Assessment: 73 yo female admitted for right foot cellulitis and now with suspected pneumonia.  Pt was receiving ceftriaxone and metronidazole initially.  Antibiotics are now being broadened to cefepime, vancomycin, and metronidazole to cover HCAP.  Goal of Therapy:  Vancomycin trough level 15-20 mcg/ml  Plan:  Preliminary review of pertinent patient information completed.  Protocol will be initiated with dose(s) of Vancomycin 1500 mg IV x 1 dose.  Je[facility]king[facility] clinical pharmacist will complete review during morning rounds to assess patient and finalize treatment regimen if needed.  Natasha BenceCline, Nalani Andreen, [facility] October 08, 2018,3:24 AM

## 2018-08-08 NOTE — Progress Notes (Signed)
Patient transferred to ICU/Stepdown per Dr. Robb Matar instruction at [phone] on 08/02/2018. Patient care turned over to Ginette Otto, RN

## 2018-08-08 NOTE — Progress Notes (Signed)
CRITICAL VALUE ALERT  Critical Value: Troponin .26  Date & Time Notied:  07/16/2018 3382  Provider Notified: Dr. Robb Matar  Orders Received/Actions taken:see chart

## 2018-08-08 NOTE — Progress Notes (Signed)
E-link called at this time to notify [facility].

## 2018-08-08 NOTE — Progress Notes (Signed)
Patient restless, confused, trying to get OOB.  Checked patient O2 sat showing 60 on heart monitor.   Warmed patient's hands and tried with Dinamap was 77.  Placed patient on 2LPM via [place]. Respiratory called to room O2 sat at 83% on 6LPM-[place].  Contacted Dr. Robb Matar regarding patient's condition and changes.  Dr. Robb Matar gave orders, orders carried out.  Dr. Robb Matar came to room to see patient and additional orders were given/placed.  Patient was placed into soft wrist restraints due to patient pulling on lines and trying to get OOB.

## 2018-08-08 NOTE — Progress Notes (Signed)
Patient in CT

## 2018-08-08 NOTE — Progress Notes (Signed)
Assisted MD Sherryll Burger with BLE assessment. Gauze dressing removed from R foot with multiple unhealed diabetic wounds, non draining. Bilateral feet are cold, purple in color, with >3 cap refill. No dorsal pedis or posterior tibial pulses palpated or dopplered by either this RN or MD Sherryll Burger.

## 2018-08-08 NOTE — Progress Notes (Signed)
CRITICAL VALUE ALERT  Critical Value: Lactic Acid >11  Date & Time Notied:  08/02/2018 8811  Provider Notified: Dr. Robb Matar  Orders Received/Actions taken: see chart

## 2018-08-08 NOTE — Progress Notes (Signed)
At approx 0950 while pt was in CT pt became pulseless, PEA on monitor, no active respiratory effort. Code blue called, CPR was initiated at this time as pt is listed as full code. Intubated by MD Lovell Sheehan at [phone] with 7 ETT, 22 at the teeth. Pt placed on zoll monitor and wide complex bradycardia with no pulse noted. CPR continued and 1 of Epi given at 0955. Pulse check at 0956 revealed wide complex bradycardia with no pulse, CPR restarted and 1 amp of epi given at 0958. MD Sherryll Burger arrived in CT at 1000. At 1002 pt remains in PEA, 1 amp of Epi and 1 amp of Bicarb given. At 1005 1 amp of epi given after pulse check revealed PEA.  CPR was continued, at 1007 pulse check pt remains in PEA, 1 amp of bicarb given at 1009 and 1 amp of epi given at 1010. At 1012 pulse check, was able to doppler a pulse at L femoral with doppler, pt noted to have dopplered femoral pulses prior this morning. Respiratory bagging pt and family has been notified. At 1015 pt's husband is at bedside, MD Sherryll Burger has notified family of pt's status and husband would like to continue ventilation until rest of family can see pt. MD Sherryll Burger notified family death would be imminent. Pt placed on ventilator and transported to ICU. Family now at bedside, husband states he would not like CPR performed if pt's heart were to stop again.

## 2018-08-08 DEATH — deceased

## 2018-08-09 LAB — CULTURE, BLOOD (ROUTINE X 2)
Culture: NO GROWTH
Culture: NO GROWTH
Special Requests: ADEQUATE
Special Requests: ADEQUATE

## 2018-09-02 ENCOUNTER — Ambulatory Visit: Payer: Medicare HMO | Admitting: Cardiology

## 2020-03-30 IMAGING — CT CT ANGIOGRAPHY AOBIFEM WITHOUT AND WITH CONTRAST
1 of 15 series · 11 of 48 positions shown, 15 images · IV contrast (Isovue)
Comparison: ABI study on 07/31/2018

CLINICAL DATA: Bilateral critical limb ischemia with right foot
cellulitis/gangrene and sudden diminished distal pulses bilaterally
today with marked lactic acidosis. Noninvasive ABI evaluation 3 days
ago demonstrated resting ABI of 0.7 on the right and 0.73 on the
left.

EXAM:
CT ANGIOGRAPHY OF ABDOMINAL AORTA WITH ILIOFEMORAL RUNOFF
TECHNIQUE: Multidetector CT imaging of the abdomen, pelvis and lower
extremities was performed using the standard protocol during bolus
administration of intravenous contrast. Multiplanar CT image
reconstructions and MIPs were obtained to evaluate the vascular
anatomy.
CONTRAST:  100mL OMNIPAQUE IOHEXOL 350 MG/ML SOLN

[Series 4: aobifem axial arterial · axial · arterial · 0.78mm/px · z∈[-1318,-241]mm · 11 of 419 slices shown, 15 images]
[im 40/419  soft-tissue]
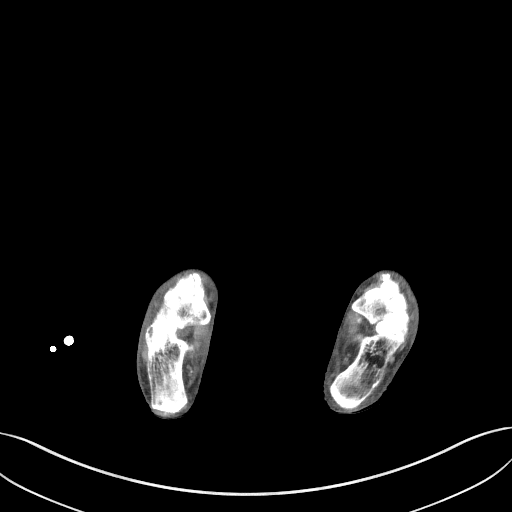
[im 40/419  bone]
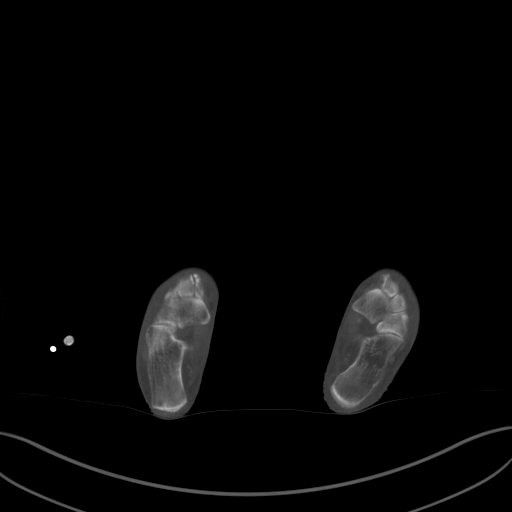
[im 80/419  soft-tissue]
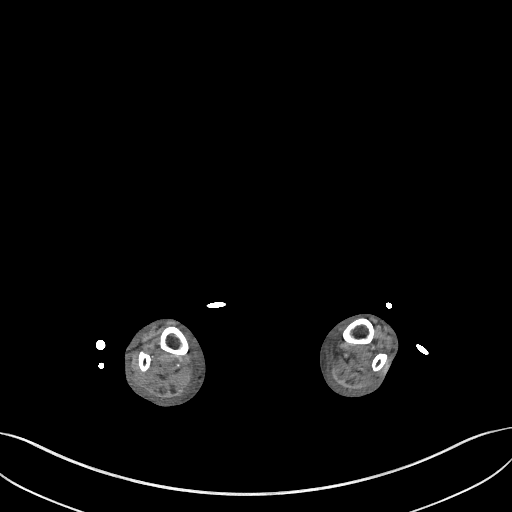
[im 120/419  soft-tissue]
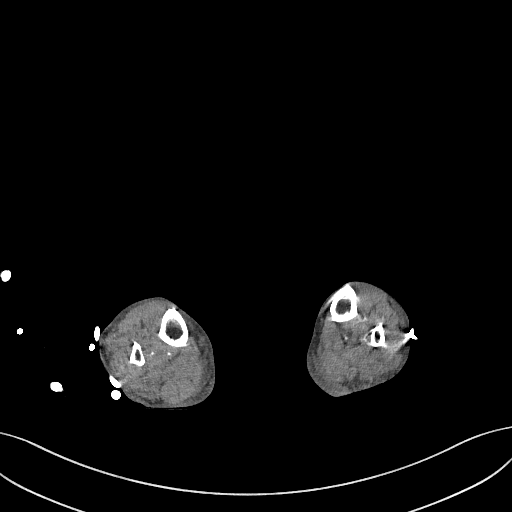
[im 160/419  soft-tissue]
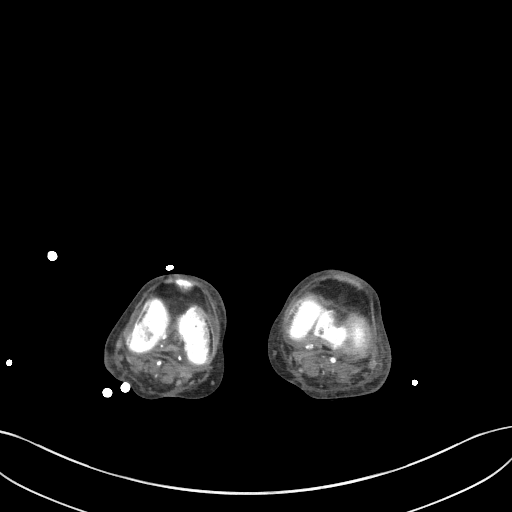
[im 219/419  soft-tissue]
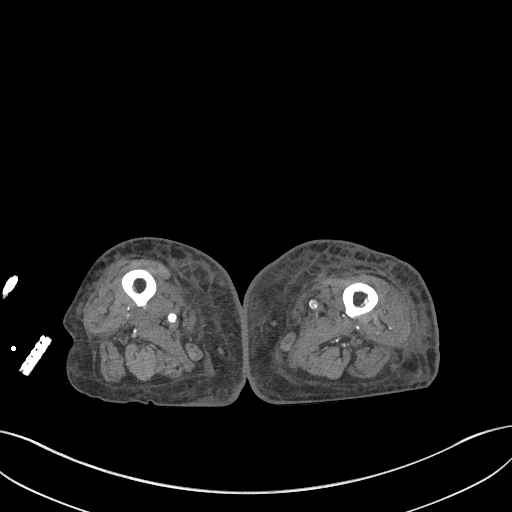
[im 259/419  soft-tissue]
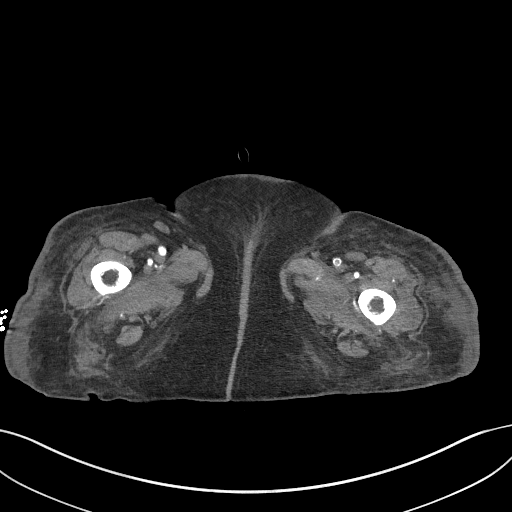
[im 299/419  soft-tissue]
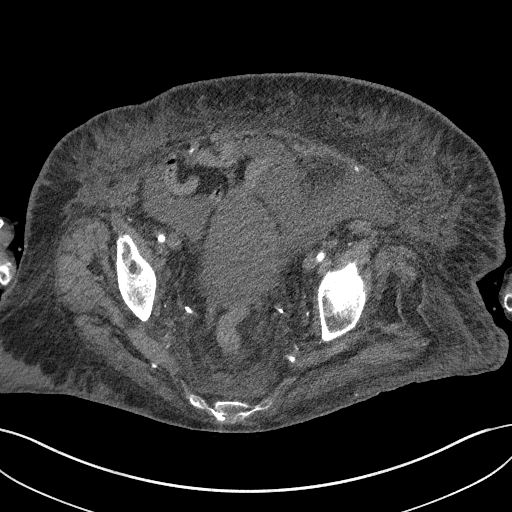
[im 339/419  soft-tissue]
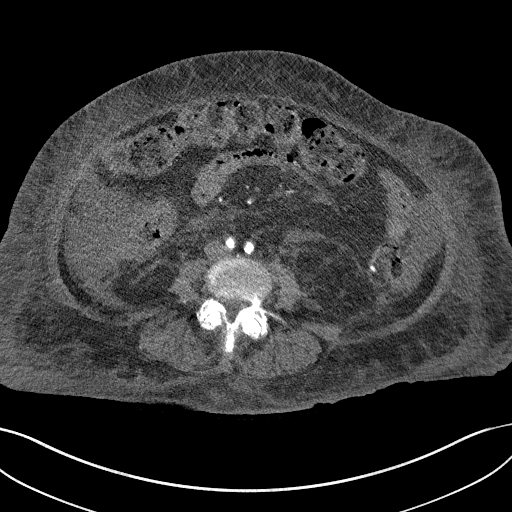
[im 339/419  lung]
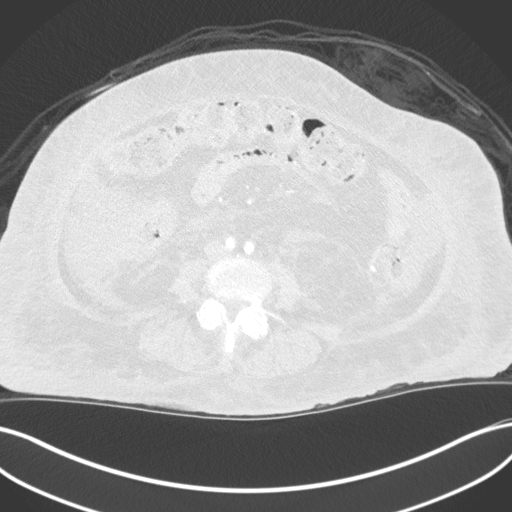
[im 359/419  lung]
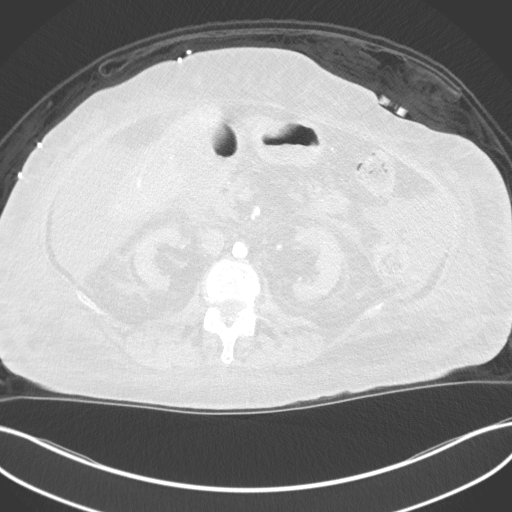
[im 379/419  soft-tissue]
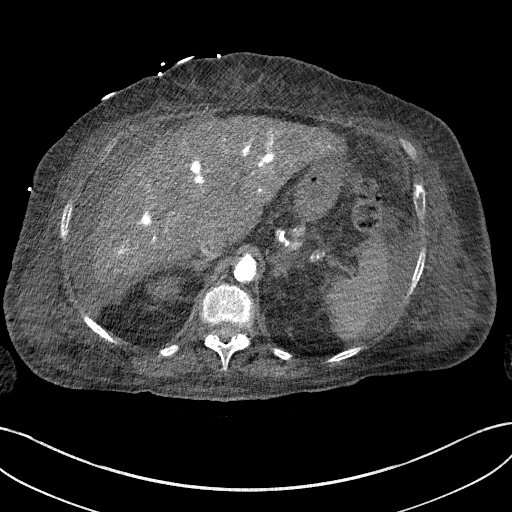
[im 379/419  lung]
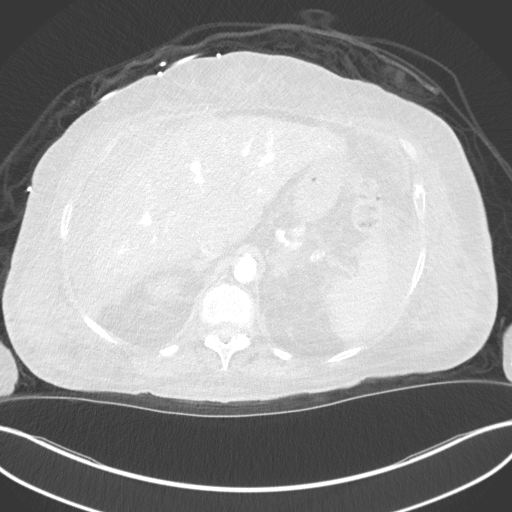
[im 379/419  bone]
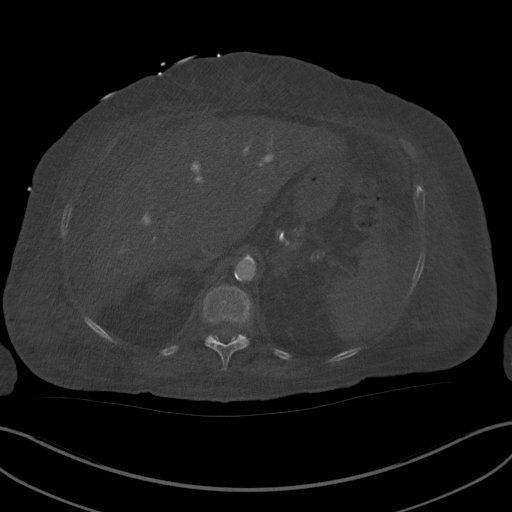
[im 399/419  lung]
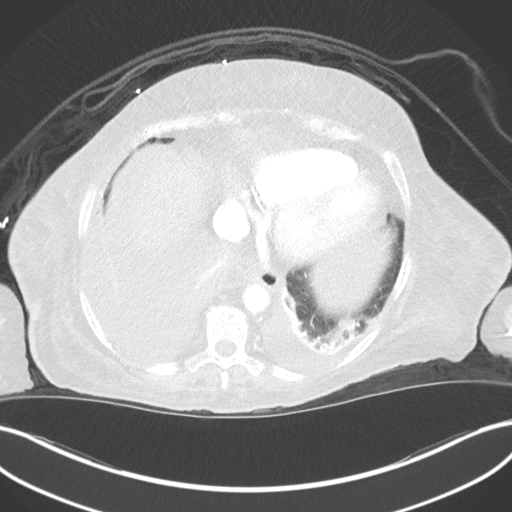

[11 of 48 positions shown; findings below may reference images not displayed]

FINDINGS: VASCULAR

Aorta: Calcified plaque is present throughout much of the abdominal
aorta without evidence of aneurysmal disease, significant aortic
stenosis or dissection.

Celiac: Calcified plaque at origin without significant stenosis.
Distal branches are patent and demonstrate normal branching anatomy.

SMA: Calcified plaque at origin without significant stenosis. Distal
branches are patent and attenuated.

Renals: Bilateral single renal arteries demonstrate no significant
stenosis.

IMA: The IMA origin appears to be patent. The vessel is very small
and distal branches are not well opacified. Part of this appearance
may be due to vasoconstriction.

RIGHT Lower Extremity

Inflow: Common, external and internal iliac arteries show scattered
calcified plaque without significant stenosis, occlusion or
aneurysmal disease.

Outflow: The common femoral artery and femoral bifurcation are
normally patent. Profunda femoral artery is normally patent. There
is subtle, thin elongated nonocclusive filling defect beginning at
roughly the juncture of the proximal and mid segments of the SFA and
suggestive of some nonocclusive thrombus. The distal SFA at the
adductor hiatus is nearly completely occluded by noncalcified
material that is suspicious for thrombus. This may also represent
noncalcified plaque. Just distal to this level is an area of similar
severe stenosis of the proximal popliteal artery just beyond the
adductor hiatus which is nearly occlusive. The rest of the popliteal
artery is patent.

Runoff: Below the knee, the posterior tibial artery is small in
caliber but appears to be continuously patent into the foot. The
peroneal artery is also patent to the level of the ankle mortise.
The anterior tibial artery appears to be occluded proximally and may
be faintly reconstituted distally.

LEFT Lower Extremity

Inflow: Left common, external and internal iliac arteries
demonstrate scattered calcified plaque without evidence of
significant stenosis, occlusion or aneurysmal disease.

Outflow: The common femoral artery is normally patent. The profunda
femoral artery and its branches demonstrate normal patency. There is
significant thrombus in the left SFA beginning just beyond its
origin with near occlusive thrombus present followed by a long
segment of essentially occlusive thrombus in the proximal to mid
SFA. This has the appearance of likely embolic thrombus as there is
a rim of flow surrounding the thrombus over several cm. The SFA is
reconstituted distally and the popliteal artery is normally patent.

Runoff: Below the knee, evaluation is limited due to significant
motion of the left lower leg at the time of imaging. The posterior
tibial and peroneal arteries are likely continuously patent. The
anterior tibial artery is difficult to assess but appears to be
patent into the distal calf.

Review of the MIP images confirms the above findings.

NON-VASCULAR

Lower chest: Moderate right pleural effusion and small left pleural
effusion. The heart is enlarged and there is significant reflux of
contrast into the intrahepatic IVC and throughout the hepatic venous
system into the periphery of the liver. Findings are consistent with
significantly elevated right heart pressures and underlying right
heart failure.

Hepatobiliary: The liver shows diffuse steatosis as well as evidence
of probable cirrhosis. No evidence of biliary obstruction. The
gallbladder appears unremarkable.

Pancreas: Unremarkable. No pancreatic ductal dilatation or
surrounding inflammatory changes.

Spleen: Normal in size without focal abnormality.

Adrenals/Urinary Tract: Atrophic appearing kidneys bilaterally
without evidence of hydronephrosis or calculi. The bladder appears
unremarkable.

Stomach/Bowel: Bowel shows no evidence of obstruction, ileus or
perforation. Diverticulosis of the sigmoid colon present.

Lymphatic: No enlarged lymph nodes identified.

Reproductive: Status post hysterectomy. No adnexal masses.

Other: Small to moderate volume ascites is scattered throughout the
peritoneal cavity. There also is severe body wall anasarca.

Musculoskeletal: No acute or significant osseous findings.
IMPRESSION: VASCULAR

1. No significant component of aortoiliac inflow disease.
2. The right lower extremity demonstrates thin elongated
nonocclusive filling defect in its proximal to mid segment and
nearly occlusive thrombus distally likely consistent with embolic
thrombus. Some of this may also represent noncalcified plaque. There
also is concentric near occlusion of the proximal popliteal artery
just below the adductor hiatus. Posterior tibial and peroneal runoff
demonstrated in the right lower leg. The anterior tibial artery is
occluded proximally and may reconstitute distally.
3. The left lower extremity demonstrates long segment essentially
occlusive filling defect throughout the mid to distal SFA. This
tubular filling defect does have some flow peripherally and has the
appearance of an elongated embolic thrombus. The left SFA
reconstitutes distally with normal popliteal patency. Posterior
tibial and peroneal arteries are likely continuously patent. As
above, left lower leg motion during acquisition limits evaluation of
runoff in the left lower leg.

NON-VASCULAR

1. Moderate right pleural effusion and small left pleural effusion.
2. Evidence of right heart failure and elevated right heart
pressures with significant reflux of contrast into the IVC and
throughout the hepatic venous system of the liver.
3. Steatosis and probable cirrhosis with associated mild to moderate
ascites in the peritoneal cavity.
4. Severe anasarca of the body wall.
